# Patient Record
Sex: Male | Born: 1950 | Race: White | Hispanic: No | Smoking: Never smoker
Health system: Southern US, Community
[De-identification: ages and names within clinical notes are randomized; demographics above are authoritative.]

## PROBLEM LIST (undated history)

## (undated) DIAGNOSIS — T8859XA Other complications of anesthesia, initial encounter: Secondary | ICD-10-CM

## (undated) DIAGNOSIS — R42 Dizziness and giddiness: Secondary | ICD-10-CM

## (undated) DIAGNOSIS — M1990 Unspecified osteoarthritis, unspecified site: Secondary | ICD-10-CM

## (undated) DIAGNOSIS — C44310 Basal cell carcinoma of skin of unspecified parts of face: Secondary | ICD-10-CM

## (undated) DIAGNOSIS — K635 Polyp of colon: Secondary | ICD-10-CM

## (undated) DIAGNOSIS — T4145XA Adverse effect of unspecified anesthetic, initial encounter: Secondary | ICD-10-CM

## (undated) DIAGNOSIS — E039 Hypothyroidism, unspecified: Secondary | ICD-10-CM

## (undated) DIAGNOSIS — D649 Anemia, unspecified: Secondary | ICD-10-CM

## (undated) HISTORY — DX: Polyp of colon: K63.5

## (undated) HISTORY — PX: DENTAL SURGERY: SHX609

## (undated) HISTORY — DX: Basal cell carcinoma of skin of unspecified parts of face: C44.310

## (undated) HISTORY — PX: HERNIA REPAIR: SHX51

---
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
## 1991-12-23 HISTORY — PX: THYROID LOBECTOMY: SHX420

## 1991-12-23 HISTORY — PX: BIOPSY THYROID: PRO38

## 1998-12-22 DIAGNOSIS — C44310 Basal cell carcinoma of skin of unspecified parts of face: Secondary | ICD-10-CM

## 1998-12-22 HISTORY — DX: Basal cell carcinoma of skin of unspecified parts of face: C44.310

## 1998-12-22 HISTORY — PX: BASAL CELL CARCINOMA EXCISION: SHX1214

## 2007-12-23 HISTORY — PX: CATARACT EXTRACTION: SUR2

## 2009-12-22 HISTORY — PX: COLONOSCOPY: SHX174

## 2015-07-25 ENCOUNTER — Encounter: Payer: Self-pay | Admitting: General Surgery

## 2015-07-31 ENCOUNTER — Encounter: Payer: Self-pay | Admitting: General Surgery

## 2015-07-31 ENCOUNTER — Ambulatory Visit (INDEPENDENT_AMBULATORY_CARE_PROVIDER_SITE_OTHER): Payer: BLUE CROSS/BLUE SHIELD | Admitting: General Surgery

## 2015-07-31 DIAGNOSIS — K4090 Unilateral inguinal hernia, without obstruction or gangrene, not specified as recurrent: Secondary | ICD-10-CM | POA: Diagnosis not present

## 2015-07-31 NOTE — Progress Notes (Signed)
Patient ID: Samuel [name], male   DOB: 12-19-1951, 64 y.o.   MRN: [id number]  Chief Complaint  Patient presents with  . Hernia    HPI Samuel [name] is a 64 y.o. male here today for a evaluation of a possible hernia. He has noticed a bulge in the right groin. Patient states he noticed this area July 14 2015. Denies any strenuous activity prior to seeing a bulge, sneezing and coughing makes it worse. States the area is a little tender in the morning hours. Bowels are regular and daily.  HPI    Past Medical History  Diagnosis Date  . Basal cell carcinoma of face 2000    Past Surgical History  Procedure Laterality Date  . Tonsillectomy  1956  . Cataract extraction Right 2009  . Biopsy thyroid Left 1993  . Basal cell carcinoma excision  2000    face  . Dental surgery    . Thyroid lobectomy Left 1993    benign    Family History  Problem Relation Age of Onset  . Cancer Mother     uterine    Social History Social History  Substance Use Topics  . Smoking status: Never Smoker   . Smokeless tobacco: Never Used  . Alcohol Use: No    No Known Allergies  Current Outpatient Prescriptions  Medication Sig Dispense Refill  . SYNTHROID 112 MCG tablet Take 1 tablet by mouth daily.  0   No current facility-administered medications for this visit.    Review of Systems Review of Systems  Constitutional: Negative.   Respiratory: Negative.   Cardiovascular: Negative.   Gastrointestinal: Negative for diarrhea and constipation.    Blood pressure 158/80, pulse 80, resp. rate 12, height 6\' 1"  (1.854 m), weight 182 lb (82.555 kg).  Physical Exam Physical Exam  Constitutional: He is oriented to person, place, and time. He appears well-developed and well-nourished.  HENT:  Mouth/Throat: Oropharynx is clear and moist.  Eyes: Conjunctivae are normal. No scleral icterus.  Neck: Neck supple.    Cardiovascular: Normal rate, regular rhythm and normal heart sounds.    Pulmonary/Chest: Effort normal and breath sounds normal.  Abdominal: Soft. Normal appearance. A hernia is present. Hernia confirmed positive in the right inguinal area. Hernia confirmed negative in the left inguinal area.  Genitourinary: Testes normal.  Lymphadenopathy:    He has no cervical adenopathy.  Neurological: He is alert and oriented to person, place, and time.  Skin: Skin is warm and dry.  Psychiatric: His behavior is normal.    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 No evidence of capsular invasion.  Assessment    Symptomatic right inguinal hernia.    Plan    The patient is very active, and elective repair is warranted.     Hernia precautions and incarceration were discussed with the patient. If they develop symptoms of an incarcerated hernia, they were encouraged to seek prompt medical attention.  I have recommended repair of the hernia using mesh on an outpatient basis in the near future. The risk of infection was reviewed. The role of prosthetic mesh to minimize the risk of recurrence was reviewed.  Patient is scheduled for surgery at [facility] on 08/08/15. He will pre admit by phone. Patient is aware of date and instructions.   PCP:  Virgie Dad 08/02/2015, 9:26 AM

## 2015-07-31 NOTE — Patient Instructions (Addendum)

## 2015-08-01 ENCOUNTER — Encounter: Payer: Self-pay | Admitting: General Surgery

## 2015-08-02 DIAGNOSIS — K4090 Unilateral inguinal hernia, without obstruction or gangrene, not specified as recurrent: Secondary | ICD-10-CM | POA: Insufficient documentation

## 2015-08-02 NOTE — H&P (Signed)
Patient ID: Samuel [name], male   DOB: Feb 18, 1951, 64 y.o.   MRN: [id number]  Chief Complaint  Patient presents with  . Hernia    HPI RESHAWN [name] is a 64 y.o. male here today for a evaluation of a possible hernia. He has noticed a bulge in the right groin. Patient states he noticed this area July 14 2015. Denies any strenuous activity prior to seeing a bulge, sneezing and coughing makes it worse. States the area is a little tender in the morning hours. Bowels are regular and daily.  HPI    Past Medical History  Diagnosis Date  . Basal cell carcinoma of face 2000    Past Surgical History  Procedure Laterality Date  . Tonsillectomy  1956  . Cataract extraction Right 2009  . Biopsy thyroid Left 1993  . Basal cell carcinoma excision  2000    face  . Dental surgery    . Thyroid lobectomy Left 1993    benign    Family History  Problem Relation Age of Onset  . Cancer Mother     uterine    Social History Social History  Substance Use Topics  . Smoking status: Never Smoker   . Smokeless tobacco: Never Used  . Alcohol Use: No    No Known Allergies  Current Outpatient Prescriptions  Medication Sig Dispense Refill  . SYNTHROID 112 MCG tablet Take 1 tablet by mouth daily.  0   No current facility-administered medications for this visit.    Review of Systems Review of Systems  Constitutional: Negative.   Respiratory: Negative.   Cardiovascular: Negative.   Gastrointestinal: Negative for diarrhea and constipation.    Blood pressure 158/80, pulse 80, resp. rate 12, height 6\' 1"  (1.854 m), weight 182 lb (82.555 kg).  Physical Exam Physical Exam  Constitutional: He is oriented to person, place, and time. He appears well-developed and well-nourished.  HENT:  Mouth/Throat: Oropharynx is clear and moist.  Eyes: Conjunctivae are normal. No scleral icterus.  Neck: Neck supple.    Cardiovascular: Normal rate, regular rhythm and normal heart sounds.     Pulmonary/Chest: Effort normal and breath sounds normal.  Abdominal: Soft. Normal appearance. A hernia is present. Hernia confirmed positive in the right inguinal area. Hernia confirmed negative in the left inguinal area.  Genitourinary: Testes normal.  Lymphadenopathy:    He has no cervical adenopathy.  Neurological: He is alert and oriented to person, place, and time.  Skin: Skin is warm and dry.  Psychiatric: His behavior is normal.    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 No evidence of capsular invasion.  Assessment    Symptomatic right inguinal hernia.    Plan    The patient is very active, and elective repair is warranted.     Hernia precautions and incarceration were discussed with the patient. If they develop symptoms of an incarcerated hernia, they were encouraged to seek prompt medical attention.  I have recommended repair of the hernia using mesh on an outpatient basis in the near future. The risk of infection was reviewed. The role of prosthetic mesh to minimize the risk of recurrence was reviewed.  Patient is scheduled for surgery at [facility] on 08/08/15. He will pre admit by phone. Patient is aware of date and instructions.   PCP:  Virgie Dad 08/02/2015, 9:26 AM

## 2015-08-03 ENCOUNTER — Encounter: Payer: Self-pay | Admitting: *Deleted

## 2015-08-03 ENCOUNTER — Other Ambulatory Visit: Payer: Self-pay

## 2015-08-03 HISTORY — PX: OTHER SURGICAL HISTORY: SHX169

## 2015-08-03 NOTE — Patient Instructions (Signed)
  Your procedure is scheduled on: 08-08-15 Report to [facility] To find out your arrival time please call [phone] between 1PM - 3PM on 08-07-15  Remember: Instructions that are not followed completely may result in serious medical risk, up to and including death, or upon the discretion of your surgeon and anesthesiologist your surgery may need to be rescheduled.    _X___ 1. Do not eat food or drink liquids after midnight. No gum chewing or hard candies.     _X___ 2. No Alcohol for 24 hours before or after surgery.   ____ 3. Bring all medications with you on the day of surgery if instructed.    ____ 4. Notify your doctor if there is any change in your medical condition     (cold, fever, infections).     Do not wear jewelry, make-up, hairpins, clips or nail polish.  Do not wear lotions, powders, or perfumes. You may wear deodorant.  Do not shave 48 hours prior to surgery. Men may shave face and neck.  Do not bring valuables to the hospital.    [facility] is not responsible for any belongings or valuables.               Contacts, dentures or bridgework may not be worn into surgery.  Leave your suitcase in the car. After surgery it may be brought to your room.  For patients admitted to the hospital, discharge time is determined by your treatment team.   Patients discharged the day of surgery will not be allowed to drive home.   Please read over the following fact sheets that you were given:      __X__ Take these medicines the morning of surgery with A SIP OF WATER:    1. SYNTHROID  2.   3.   4.  5.  6.  ____ Fleet Enema (as directed)   ____ Use CHG Soap as directed  ____ Use inhalers on the day of surgery  ____ Stop metformin 2 days prior to surgery    ____ Take 1/2 of usual insulin dose the night before surgery and none on the morning of surgery.   ____ Stop Coumadin/Plavix/aspirin-N/A  ____ Stop Anti-inflammatories-NO NSAIDS OR ASA  PRODUCTS-TYLENOL OK   ____ Stop supplements until after surgery.    ____ Bring C-Pap to the hospital.

## 2015-08-06 NOTE — Pre-Procedure Instructions (Signed)
Notified Dr Bary Castilla concening patients recent dental implant on 08/03/15.

## 2015-08-08 ENCOUNTER — Ambulatory Visit: Payer: BLUE CROSS/BLUE SHIELD | Admitting: Anesthesiology

## 2015-08-08 ENCOUNTER — Encounter: Payer: Self-pay | Admitting: *Deleted

## 2015-08-08 ENCOUNTER — Encounter
Admission: RE | Disposition: A | Discharge status: Home / Self Care | Payer: Self-pay | Source: Ambulatory Visit | Attending: General Surgery

## 2015-08-08 ENCOUNTER — Ambulatory Visit
Admission: RE | Admit: 2015-08-08 | Discharge: 2015-08-08 | Disposition: A | Discharge status: Home / Self Care | Payer: BLUE CROSS/BLUE SHIELD | Source: Ambulatory Visit | Attending: General Surgery | Admitting: General Surgery

## 2015-08-08 DIAGNOSIS — K4090 Unilateral inguinal hernia, without obstruction or gangrene, not specified as recurrent: Secondary | ICD-10-CM | POA: Insufficient documentation

## 2015-08-08 DIAGNOSIS — Z9849 Cataract extraction status, unspecified eye: Secondary | ICD-10-CM | POA: Insufficient documentation

## 2015-08-08 DIAGNOSIS — Z8049 Family history of malignant neoplasm of other genital organs: Secondary | ICD-10-CM | POA: Insufficient documentation

## 2015-08-08 DIAGNOSIS — Z79899 Other long term (current) drug therapy: Secondary | ICD-10-CM | POA: Diagnosis not present

## 2015-08-08 DIAGNOSIS — Z85828 Personal history of other malignant neoplasm of skin: Secondary | ICD-10-CM | POA: Insufficient documentation

## 2015-08-08 HISTORY — DX: Anemia, unspecified: D64.9

## 2015-08-08 HISTORY — DX: Hypothyroidism, unspecified: E03.9

## 2015-08-08 HISTORY — PX: INGUINAL HERNIA REPAIR: SHX194

## 2015-08-08 SURGERY — REPAIR, HERNIA, INGUINAL, ADULT
Anesthesia: General | Site: Groin | Laterality: Right | Wound class: Clean

## 2015-08-08 MED ORDER — CEFAZOLIN SODIUM-DEXTROSE 2-3 GM-% IV SOLR
INTRAVENOUS | Status: AC
  Administered 2015-08-08: 2 g via INTRAVENOUS
  Filled 2015-08-08: qty 50

## 2015-08-08 MED ORDER — BUPIVACAINE-EPINEPHRINE (PF) 0.5% -1:200000 IJ SOLN
INTRAMUSCULAR | Status: DC | PRN
Start: 2015-08-08 — End: 2015-08-08
  Administered 2015-08-08: 30 mL

## 2015-08-08 MED ORDER — FENTANYL CITRATE (PF) 100 MCG/2ML IJ SOLN: 25.0000 ug | INTRAMUSCULAR | Status: DC | PRN

## 2015-08-08 MED ORDER — LACTATED RINGERS IV SOLN
INTRAVENOUS | Status: DC
  Administered 2015-08-08 (×2): via INTRAVENOUS

## 2015-08-08 MED ORDER — ONDANSETRON HCL 4 MG/2ML IJ SOLN
INTRAMUSCULAR | Status: DC | PRN
  Administered 2015-08-08: 4 mg via INTRAVENOUS

## 2015-08-08 MED ORDER — OXYCODONE HCL 5 MG PO TABS: 5.0000 mg | ORAL_TABLET | Freq: Once | ORAL | Status: DC | PRN

## 2015-08-08 MED ORDER — KETOROLAC TROMETHAMINE 30 MG/ML IJ SOLN
INTRAMUSCULAR | Status: DC | PRN
  Administered 2015-08-08: 30 mg via INTRAVENOUS

## 2015-08-08 MED ORDER — MIDAZOLAM HCL 2 MG/2ML IJ SOLN
INTRAMUSCULAR | Status: DC | PRN
  Administered 2015-08-08: 2 mg via INTRAVENOUS

## 2015-08-08 MED ORDER — FENTANYL CITRATE (PF) 100 MCG/2ML IJ SOLN
INTRAMUSCULAR | Status: DC | PRN
Start: 2015-08-08 — End: 2015-08-08
  Administered 2015-08-08 (×2): 100 ug via INTRAVENOUS

## 2015-08-08 MED ORDER — CEFAZOLIN SODIUM-DEXTROSE 2-3 GM-% IV SOLR
2.0000 g | INTRAVENOUS | Status: AC
  Administered 2015-08-08: 2 g via INTRAVENOUS

## 2015-08-08 MED ORDER — FAMOTIDINE 20 MG PO TABS
ORAL_TABLET | ORAL | Status: AC
  Administered 2015-08-08: 20 mg via ORAL
  Filled 2015-08-08: qty 1

## 2015-08-08 MED ORDER — OXYCODONE HCL 5 MG/5ML PO SOLN: 5.0000 mg | Freq: Once | ORAL | Status: DC | PRN

## 2015-08-08 MED ORDER — PROPOFOL 10 MG/ML IV BOLUS
INTRAVENOUS | Status: DC | PRN
  Administered 2015-08-08: 200 mg via INTRAVENOUS

## 2015-08-08 MED ORDER — BUPIVACAINE-EPINEPHRINE (PF) 0.5% -1:200000 IJ SOLN
INTRAMUSCULAR | Status: AC
  Filled 2015-08-08: qty 30

## 2015-08-08 MED ORDER — FAMOTIDINE 20 MG PO TABS
20.0000 mg | ORAL_TABLET | Freq: Once | ORAL | Status: AC
  Administered 2015-08-08: 20 mg via ORAL

## 2015-08-08 MED ORDER — HYDROCODONE-ACETAMINOPHEN 5-325 MG PO TABS: 1.0000 | ORAL_TABLET | Freq: Four times a day (QID) | ORAL | Status: DC | PRN

## 2015-08-08 MED ORDER — ACETAMINOPHEN 10 MG/ML IV SOLN
INTRAVENOUS | Status: DC | PRN
  Administered 2015-08-08: 1000 mg via INTRAVENOUS

## 2015-08-08 MED ORDER — DEXAMETHASONE SODIUM PHOSPHATE 4 MG/ML IJ SOLN
INTRAMUSCULAR | Status: DC | PRN
  Administered 2015-08-08: 10 mg via INTRAVENOUS

## 2015-08-08 MED ORDER — ACETAMINOPHEN 10 MG/ML IV SOLN
INTRAVENOUS | Status: AC
  Filled 2015-08-08: qty 100

## 2015-08-08 SURGICAL SUPPLY — 33 items
BLADE SURG 15 STRL SS SAFETY (BLADE) ×6 IMPLANT
CANISTER SUCT 1200ML W/VALVE (MISCELLANEOUS) ×3 IMPLANT
CHLORAPREP W/TINT 26ML (MISCELLANEOUS) ×3 IMPLANT
CLOSURE WOUND 1/2 X4 (GAUZE/BANDAGES/DRESSINGS) ×1
DECANTER SPIKE VIAL GLASS SM (MISCELLANEOUS) ×3 IMPLANT
DRAIN PENROSE 1/4X12 LTX (DRAIN) ×3 IMPLANT
DRAPE LAPAROTOMY 100X77 ABD (DRAPES) ×3 IMPLANT
DRESSING TELFA 4X3 1S ST N-ADH (GAUZE/BANDAGES/DRESSINGS) ×3 IMPLANT
DRSG TEGADERM 4X4.75 (GAUZE/BANDAGES/DRESSINGS) ×3 IMPLANT
GLOVE BIO SURGEON STRL SZ7.5 (GLOVE) ×3 IMPLANT
GLOVE INDICATOR 8.0 STRL GRN (GLOVE) ×3 IMPLANT
GOWN STRL REUS W/ TWL LRG LVL3 (GOWN DISPOSABLE) ×2 IMPLANT
GOWN STRL REUS W/TWL LRG LVL3 (GOWN DISPOSABLE) ×4
KIT RM TURNOVER STRD PROC AR (KITS) ×3 IMPLANT
LABEL OR SOLS (LABEL) ×3 IMPLANT
MESH HERNIA 6X12 ULTRAPRO MED (Mesh General) ×1 IMPLANT
MESH HERNIA ULTRAPRO MED (Mesh General) ×2 IMPLANT
NDL SAFETY 22GX1.5 (NEEDLE) ×6 IMPLANT
NDL SAFETY 25GX1.5 (NEEDLE) ×3 IMPLANT
PACK BASIN MINOR ARMC (MISCELLANEOUS) ×3 IMPLANT
PAD GROUND ADULT SPLIT (MISCELLANEOUS) ×3 IMPLANT
STRIP CLOSURE SKIN 1/2X4 (GAUZE/BANDAGES/DRESSINGS) ×2 IMPLANT
SUT SURGILON 0 BLK (SUTURE) ×6 IMPLANT
SUT VIC AB 2-0 SH 27 (SUTURE) ×2
SUT VIC AB 2-0 SH 27XBRD (SUTURE) ×1 IMPLANT
SUT VIC AB 3-0 54X BRD REEL (SUTURE) ×1 IMPLANT
SUT VIC AB 3-0 BRD 54 (SUTURE) ×2
SUT VIC AB 3-0 SH 27 (SUTURE) ×2
SUT VIC AB 3-0 SH 27X BRD (SUTURE) ×1 IMPLANT
SUT VIC AB 4-0 FS2 27 (SUTURE) ×3 IMPLANT
SWABSTK COMLB BENZOIN TINCTURE (MISCELLANEOUS) ×3 IMPLANT
SYR 3ML LL SCALE MARK (SYRINGE) ×3 IMPLANT
SYR CONTROL 10ML (SYRINGE) ×6 IMPLANT

## 2015-08-08 NOTE — Discharge Instructions (Signed)
AMBULATORY SURGERY  DISCHARGE INSTRUCTIONS   1) The drugs that you were given will stay in your system until tomorrow so for the next 24 hours you should not:  A) Drive an automobile B) Make any legal decisions C) Drink any alcoholic beverage   2) You may resume regular meals tomorrow.  Today it is better to start with liquids and gradually work up to solid foods.  You may eat anything you prefer, but it is better to start with liquids, then soup and crackers, and gradually work up to solid foods.   3) Please notify your doctor immediately if you have any unusual bleeding, trouble breathing, redness and pain at the surgery site, drainage, fever, or pain not relieved by medication.    4) Additional Instructions: Ice pack to incisional area as needed.  Splint incisional area when you cough, sneeze or move.        Please contact your physician with any problems or Same Day Surgery at [phone], Monday through Friday 6 am to 4 pm, or [place] at [facility] number at [phone].

## 2015-08-08 NOTE — Transfer of Care (Signed)
Immediate Anesthesia Transfer of Care Note  Patient: Samuel [name]  Procedure(s) Performed: Procedure(s): HERNIA REPAIR INGUINAL ADULT (Right)  Patient Location: PACU  Anesthesia Type:General  Level of Consciousness: sedated  Airway & Oxygen Therapy: Patient Spontanous Breathing and Patient connected to face mask oxygen  Post-op Assessment: Report given to RN  Post vital signs: Reviewed and stable  Last Vitals:  Filed Vitals:   08/08/15 1139  BP: 176/93  Pulse: 81  Temp: 36.9 C  Resp: 16    Complications: No apparent anesthesia complications

## 2015-08-08 NOTE — H&P (Signed)
No change in clinical history or exam.  Clear cardiopulmonary exam.  Patient will continue his oxacillin for dental implant infection prevention post surgery.  Surgical site initialed.

## 2015-08-08 NOTE — Anesthesia Preprocedure Evaluation (Addendum)
Anesthesia Evaluation  Patient identified by MRN, date of birth, ID band Patient awake    Reviewed: Allergy & Precautions, H&P , NPO status , Patient's Chart, lab work & pertinent test results  History of Anesthesia Complications Negative for: history of anesthetic complications  Airway Mallampati: II  TM Distance: >3 FB Neck ROM: full    Dental no notable dental hx. (+) Teeth Intact   Pulmonary neg pulmonary ROS,  breath sounds clear to auscultation  Pulmonary exam normal       Cardiovascular negative cardio ROS Normal cardiovascular examRhythm:regular Rate:Normal     Neuro/Psych negative neurological ROS  negative psych ROS   GI/Hepatic negative GI ROS, Neg liver ROS,   Endo/Other  negative endocrine ROSHypothyroidism   Renal/GU negative Renal ROS  negative genitourinary   Musculoskeletal   Abdominal   Peds  Hematology negative hematology ROS (+)   Anesthesia Other Findings Past Medical History:   Basal cell carcinoma of face                    2000         Hypothyroidism                                               Anemia                                                         Comment:h/o   Reproductive/Obstetrics negative OB ROS                            Anesthesia Physical Anesthesia Plan  ASA: III  Anesthesia Plan: General ETT   Post-op Pain Management:    Induction:   Airway Management Planned:   Additional Equipment:   Intra-op Plan:   Post-operative Plan:   Informed Consent: I have reviewed the patients History and Physical, chart, labs and discussed the procedure including the risks, benefits and alternatives for the proposed anesthesia with the patient or authorized representative who has indicated his/her understanding and acceptance.   Dental Advisory Given  Plan Discussed with: Anesthesiologist, CRNA and Surgeon  Anesthesia Plan Comments:          Anesthesia Quick Evaluation

## 2015-08-08 NOTE — Anesthesia Postprocedure Evaluation (Signed)
  Anesthesia Post-op Note  Patient: Samuel [name]  Procedure(s) Performed: Procedure(s): HERNIA REPAIR INGUINAL ADULT (Right)  Anesthesia type:General ETT  Patient location: PACU  Post pain: Pain level controlled  Post assessment: Post-op Vital signs reviewed, Patient's Cardiovascular Status Stable, Respiratory Function Stable, Patent Airway and No signs of Nausea or vomiting  Post vital signs: Reviewed and stable  Last Vitals:  Filed Vitals:   08/08/15 1458  BP: 140/83  Pulse: 70  Temp:   Resp: 18    Level of consciousness: awake, alert  and patient cooperative  Complications: No apparent anesthesia complications

## 2015-08-08 NOTE — Op Note (Signed)
Preoperative diagnosis: Right inguinal hernia.  Postoperative diagnosis: Same.  Operative procedure: Right inguinal hernia repair with medium Ultra Pro mesh.  Operative surgeon: Ollen Bowl, M.D.  Anesthesia: Gen. by LMA, Marcaine 0.5% with 1-200,000 units of epinephrine, 30 mL; Toradol 30 mg.  Estimated blood loss: Less than 5 mL.  Clinical note this 64 year old male who is an ultrasound medical right inguinal hernia was amenable for elective repair. He received Kefzol prior to procedure.  Operative note: With the patient had general anesthesia the right groin was prepped with chlor prep and drape. A 5 cm skin line incision was made after field block anesthesia was established with Marcaine with epinephrine. The skin was incised sharply and the remaining dissection completed with electrocautery. The external oblique was opened in the direction of its fibers. The cord was mobilized and a generous sized indirect sac was noted. Just general laxity in the posterior floor was noted. The hernia sac was dissected free of the cord structured and returned to the preperitoneal space. The preperitoneal space was cleared medium ultra Pro mesh space placed. The external component was laid along the floor the inguinal canal and anchored to the pubic tubercle with 0 Surgilon. It was then tacked to the inguinal ligament with interrupted 0 Surgilon sutures. A lateral slit was made for cord passage and closed with similar sutures. The medial and superior borders of the mesh were anchored to the transverse abdominis aponeurosis was interrupted 0 Surgilon sutures. The ilioinguinal nerve was identified. The iliohypogastric nerve was not seen. Care was taken to avoid nerve injury. The cord was returned to its bed. Toradol was placed in the wound. The external oblique was closed with a running 2-0 Vicryls suture. Plantar Scarpa's fascia was closed with a running 3-0 Vicryls suture. The skin was closed with a running 4-0  Vicryls suture. Benzoin, Steri-Strips, Telfa and Tegaderm dressings were applied.  The patient tolerated the procedure well and was taken to recovery in stable condition.

## 2015-08-08 NOTE — Anesthesia Procedure Notes (Signed)
Procedure Name: Awake intubation Date/Time: 08/08/2015 11:40 AM Performed by: Nelda Marseille Pre-anesthesia Checklist: Patient identified, Patient being monitored, Timeout performed, Emergency Drugs available and Suction available Patient Re-evaluated:Patient Re-evaluated prior to inductionOxygen Delivery Method: Circle system utilized Preoxygenation: Pre-oxygenation with 100% oxygen Intubation Type: IV induction Ventilation: Mask ventilation without difficulty LMA: LMA inserted LMA Size: 4.5 and 4.0 Laryngoscope Size: Mac and 3 Grade View: Grade I Tube type: Oral Tube size: 7.0 mm Number of attempts: 1 Airway Equipment and Method: Stylet Placement Confirmation: ETT inserted through vocal cords under direct vision,  positive ETCO2 and breath sounds checked- equal and bilateral Secured at: 21 cm Tube secured with: Tape Dental Injury: Teeth and Oropharynx as per pre-operative assessment  Comments: 4.5 LMA inserted.  Leak detected.  4.5 taken out and size 4 placed.  No leaks good ET CO2 and exp TV.  No complications.

## 2015-08-08 NOTE — OR Nursing (Signed)
5 Dr. Bary Castilla into see pt and daughter.

## 2015-08-09 ENCOUNTER — Encounter: Payer: Self-pay | Admitting: General Surgery

## 2015-08-15 ENCOUNTER — Ambulatory Visit (INDEPENDENT_AMBULATORY_CARE_PROVIDER_SITE_OTHER): Payer: BLUE CROSS/BLUE SHIELD | Admitting: General Surgery

## 2015-08-15 ENCOUNTER — Encounter: Payer: Self-pay | Admitting: General Surgery

## 2015-08-15 VITALS — BP 122/62 | HR 68 | Resp 12 | Ht 73.0 in | Wt 182.0 lb

## 2015-08-15 DIAGNOSIS — K4090 Unilateral inguinal hernia, without obstruction or gangrene, not specified as recurrent: Secondary | ICD-10-CM

## 2015-08-15 DIAGNOSIS — R58 Hemorrhage, not elsewhere classified: Secondary | ICD-10-CM | POA: Insufficient documentation

## 2015-08-15 NOTE — Progress Notes (Signed)
Patient ID: Samuel [name], male   DOB: Aug 29, 1951, 64 y.o.   MRN: [id number]  Chief Complaint  Patient presents with  . Routine Post Op    HPI Samuel [name] is a 64 y.o. male.  Here today for his postoperative visit, right inguinal hernia on 08-08-15. He states he is doing well. He has went back to work as well.  HPI  Past Medical History  Diagnosis Date  . Basal cell carcinoma of face 2000  . Hypothyroidism   . Anemia     h/o    Past Surgical History  Procedure Laterality Date  . Tonsillectomy  1956  . Cataract extraction Right 2009  . Biopsy thyroid Left 1993  . Basal cell carcinoma excision  2000    face  . Dental surgery    . Thyroid lobectomy Left 1993    benign  . Dental implant  08-03-15  . Inguinal hernia repair Right 08/08/2015    Procedure: HERNIA REPAIR INGUINAL ADULT;  Surgeon: Robert Bellow, MD;  Location: [facility] ORS;  Service: General;  Laterality: Right;    Family History  Problem Relation Age of Onset  . Cancer Mother     uterine    Social History Social History  Substance Use Topics  . Smoking status: Never Smoker   . Smokeless tobacco: Never Used  . Alcohol Use: No    No Known Allergies  Current Outpatient Prescriptions  Medication Sig Dispense Refill  . MULTIPLE VITAMIN PO Take 1 tablet by mouth every morning.    Marland Kitchen SYNTHROID 112 MCG tablet Take 1 tablet by mouth daily.  0   No current facility-administered medications for this visit.    Review of Systems Review of Systems  Constitutional: Negative.   Respiratory: Negative.   Cardiovascular: Negative.     Blood pressure 122/62, pulse 68, resp. rate 12, height 6\' 1"  (1.854 m), weight 182 lb (82.555 kg).  Physical Exam Physical Exam  Abdominal: Soft. There is no tenderness. No hernia.  Genitourinary:     Testicles are symmetrical. Area of ecchymosis suggested bleeding from a vessel along the pubic tubercle.      Assessment    Doing well status post right angle hernia  repair.  Extensive ecchymosis, resolving.    Plan    The patient will increase his activity as tolerated. Proper lifting technique was reviewed.     follow-up will be in one month for final examination.   PCP:  Ronna Polio, Kelvin Cellar 08/15/2015, 11:36 AM

## 2015-09-11 ENCOUNTER — Ambulatory Visit (INDEPENDENT_AMBULATORY_CARE_PROVIDER_SITE_OTHER): Payer: BLUE CROSS/BLUE SHIELD | Admitting: General Surgery

## 2015-09-11 VITALS — BP 116/64 | HR 70 | Resp 12 | Ht 73.0 in | Wt 181.0 lb

## 2015-09-11 DIAGNOSIS — K4090 Unilateral inguinal hernia, without obstruction or gangrene, not specified as recurrent: Secondary | ICD-10-CM

## 2015-09-11 NOTE — Progress Notes (Signed)
Patient ID: Samuel [name], male   DOB: 19-Oct-1951, 64 y.o.   MRN: [id number]  Chief Complaint  Patient presents with  . Follow-up    right inguinal hernia    HPI Samuel [name] is a 64 y.o. male.Here today for his postoperative visit, right inguinal hernia on 08-08-15. He states he is doing well.   HPI  Past Medical History  Diagnosis Date  . Basal cell carcinoma of face 2000  . Hypothyroidism   . Anemia     h/o    Past Surgical History  Procedure Laterality Date  . Tonsillectomy  1956  . Cataract extraction Right 2009  . Biopsy thyroid Left 1993  . Basal cell carcinoma excision  2000    face  . Dental surgery    . Thyroid lobectomy Left 1993    benign  . Dental implant  08-03-15  . Inguinal hernia repair Right 08/08/2015    Procedure: HERNIA REPAIR INGUINAL ADULT;  Surgeon: Robert Bellow, MD;  Location: [facility] ORS;  Service: General;  Laterality: Right;    Family History  Problem Relation Age of Onset  . Cancer Mother     uterine    Social History Social History  Substance Use Topics  . Smoking status: Never Smoker   . Smokeless tobacco: Never Used  . Alcohol Use: No    No Known Allergies  Current Outpatient Prescriptions  Medication Sig Dispense Refill  . MULTIPLE VITAMIN PO Take 1 tablet by mouth every morning.    Marland Kitchen SYNTHROID 112 MCG tablet Take 1 tablet by mouth daily.  0   No current facility-administered medications for this visit.    Review of Systems Review of Systems  Constitutional: Negative.   Respiratory: Negative.   Cardiovascular: Negative.     Blood pressure 116/64, pulse 70, resp. rate 12, height 6\' 1"  (1.854 m), weight 181 lb (82.101 kg).  Physical Exam Physical Exam  Constitutional: He is oriented to person, place, and time. He appears well-developed and well-nourished.  Abdominal:    Right inguinal hernia repair is intact and healing well.   Neurological: He is alert and oriented to person, place, and time.  Skin: Skin is  warm and dry.      Assessment    Doing well status post right inguinal hernia repair.  Resolution of extensive ecchymosis noted on postoperative visit #1.    Plan    The patient will increase his activity as tolerated. Proper lifting technique was reviewed.    Patient to return as needed. PCP:  Mack Hook 09/11/2015, 9:36 PM

## 2015-09-11 NOTE — Patient Instructions (Addendum)
Patient to return as needed. 

## 2015-12-26 ENCOUNTER — Other Ambulatory Visit: Payer: Self-pay | Admitting: Family Medicine

## 2016-01-11 LAB — HEMOGLOBIN A1C: Hemoglobin A1C: 5.8

## 2016-01-11 LAB — LIPID PANEL
CHOLESTEROL: 191 mg/dL (ref 0–200)
HDL: 73 mg/dL — AB (ref 35–70)
LDL CALC: 104 mg/dL
TRIGLYCERIDES: 70 mg/dL (ref 40–160)

## 2016-01-11 LAB — PSA: PSA: 1

## 2016-01-11 LAB — TSH: TSH: 1.3 u[IU]/mL (ref <=5.90)

## 2016-01-11 LAB — BASIC METABOLIC PANEL
CREATININE: 0.9 mg/dL (ref <=1.3)
GLUCOSE: 102 mg/dL
POTASSIUM: 4.3 mmol/L (ref 3.4–5.3)

## 2016-01-11 LAB — HEPATIC FUNCTION PANEL
ALT: 32 U/L (ref 10–40)
AST: 28 U/L (ref 14–40)

## 2016-02-06 ENCOUNTER — Ambulatory Visit: Payer: BLUE CROSS/BLUE SHIELD | Admitting: General Surgery

## 2016-02-07 ENCOUNTER — Encounter: Payer: Self-pay | Admitting: General Surgery

## 2016-02-07 ENCOUNTER — Ambulatory Visit (INDEPENDENT_AMBULATORY_CARE_PROVIDER_SITE_OTHER): Payer: Medicare Other | Admitting: General Surgery

## 2016-02-07 VITALS — BP 156/86 | HR 80 | Resp 14 | Ht 73.0 in | Wt 184.0 lb

## 2016-02-07 DIAGNOSIS — Z86010 Personal history of colonic polyps: Secondary | ICD-10-CM

## 2016-02-07 DIAGNOSIS — K4090 Unilateral inguinal hernia, without obstruction or gangrene, not specified as recurrent: Secondary | ICD-10-CM

## 2016-02-07 NOTE — Patient Instructions (Addendum)
The patient is aware to call back for any questions or concerns. Inguinal Hernia, Adult Muscles help keep everything in the body in its proper place. But if a weak spot in the muscles develops, something can poke through. That is called a hernia. When this happens in the lower part of the belly (abdomen), it is called an inguinal hernia. (It takes its name from a part of the body in this region called the inguinal canal.) A weak spot in the wall of muscles lets some fat or part of the small intestine bulge through. An inguinal hernia can develop at any age. Men get them more often than women. CAUSES  In adults, an inguinal hernia develops over time.  It can be triggered by:  Suddenly straining the muscles of the lower abdomen.  Lifting heavy objects.  Straining to have a bowel movement. Difficult bowel movements (constipation) can lead to this.  Constant coughing. This may be caused by smoking or lung disease.  Being overweight.  Being pregnant.  Working at a job that requires long periods of standing or heavy lifting.  Having had an inguinal hernia before. One type can be an emergency situation. It is called a strangulated inguinal hernia. It develops if part of the small intestine slips through the weak spot and cannot get back into the abdomen. The blood supply can be cut off. If that happens, part of the intestine may die. This situation requires emergency surgery. SYMPTOMS  Often, a small inguinal hernia has no symptoms. It is found when a healthcare provider does a physical exam. Larger hernias usually have symptoms.   In adults, symptoms may include:  A lump in the groin. This is easier to see when the person is standing. It might disappear when lying down.  In men, a lump in the scrotum.  Pain or burning in the groin. This occurs especially when lifting, straining or coughing.  A dull ache or feeling of pressure in the groin.  Signs of a strangulated hernia can  include:  A bulge in the groin that becomes very painful and tender to the touch.  A bulge that turns red or purple.  Fever, nausea and vomiting.  Inability to have a bowel movement or to pass gas. DIAGNOSIS  To decide if you have an inguinal hernia, a healthcare provider will probably do a physical examination.  This will include asking questions about any symptoms you have noticed.  The healthcare provider might feel the groin area and ask you to cough. If an inguinal hernia is felt, the healthcare provider may try to slide it back into the abdomen.  Usually no other tests are needed. TREATMENT  Treatments can vary. The size of the hernia makes a difference. Options include:  Watchful waiting. This is often suggested if the hernia is small and you have had no symptoms.  No medical procedure will be done unless symptoms develop.  You will need to watch closely for symptoms. If any occur, contact your healthcare provider right away.  Surgery. This is used if the hernia is larger or you have symptoms.  Open surgery. This is usually an outpatient procedure (you will not stay overnight in a hospital). An cut (incision) is made through the skin in the groin. The hernia is put back inside the abdomen. The weak area in the muscles is then repaired by herniorrhaphy or hernioplasty. Herniorrhaphy: in this type of surgery, the weak muscles are sewn back together. Hernioplasty: a patch or mesh is   used to close the weak area in the abdominal wall.  Laparoscopy. In this procedure, a surgeon makes small incisions. A thin tube with a tiny video camera (called a laparoscope) is put into the abdomen. The surgeon repairs the hernia with mesh by looking with the video camera and using two long instruments. HOME CARE INSTRUCTIONS   After surgery to repair an inguinal hernia:  You will need to take pain medicine prescribed by your healthcare provider. Follow all directions carefully.  You will need  to take care of the wound from the incision.  Your activity will be restricted for awhile. This will probably include no heavy lifting for several weeks. You also should not do anything too active for a few weeks. When you can return to work will depend on the type of job that you have.  During "watchful waiting" periods, you should:  Maintain a healthy weight.  Eat a diet high in fiber (fruits, vegetables and whole grains).  Drink plenty of fluids to avoid constipation. This means drinking enough water and other liquids to keep your urine clear or pale yellow.  Do not lift heavy objects.  Do not stand for long periods of time.  Quit smoking. This should keep you from developing a frequent cough. SEEK MEDICAL CARE IF:   A bulge develops in your groin area.  You feel pain, a burning sensation or pressure in the groin. This might be worse if you are lifting or straining.  You develop a fever of more than 100.5 F (38.1 C). SEEK IMMEDIATE MEDICAL CARE IF:   Pain in the groin increases suddenly.  A bulge in the groin gets bigger suddenly and does not go down.  For men, there is sudden pain in the scrotum. Or, the size of the scrotum increases.  A bulge in the groin area becomes red or purple and is painful to touch.  You have nausea or vomiting that does not go away.  You feel your heart beating much faster than normal.  You cannot have a bowel movement or pass gas.  You develop a fever of more than 102.0 F (38.9 C).   This information is not intended to replace advice given to you by your health care provider. Make sure you discuss any questions you have with your health care provider.   Document Released: 04/26/2009 Document Revised: 03/01/2012 Document Reviewed: 06/11/2015 Elsevier Interactive Patient Education 2016 [employer].   Patient's surgery has been scheduled for 02-28-16 at [facility].

## 2016-02-07 NOTE — Progress Notes (Signed)
Patient ID: Samuel [name], male   DOB: Sep 28, 1951, 65 y.o.   MRN: [id number]  Chief Complaint  Patient presents with  . Hernia    HPI Samuel [name] is a 65 y.o. male.  Here today for evaluation of a left hernia. He does not remember doing and thing that triggered the left groin knot. He states he noticed it at the end of January. He states he did have a sinus infection over Christmas and had a bad cough.  He did start with an upper respiratory cold, cough and congestion on Monday. The patient underwent repair of an indirect inguinal hernia in August 2016 making use of a medium Ultra Pro mesh. I personally reviewed the patient's history.  HPI  Past Medical History  Diagnosis Date  . Basal cell carcinoma of face 2000  . Hypothyroidism   . Anemia     h/o  . Colon polyp     Past Surgical History  Procedure Laterality Date  . Tonsillectomy  1956  . Cataract extraction Right 2009  . Biopsy thyroid Left 1993  . Basal cell carcinoma excision  2000    face  . Dental surgery    . Thyroid lobectomy Left 1993    benign  . Dental implant  08-03-15  . Inguinal hernia repair Right 08/08/2015    Procedure: HERNIA REPAIR INGUINAL ADULT;  Surgeon: Samuel Bellow, MD;  Location: [facility] ORS;  Service: General;  Laterality: Right;  . Colonoscopy  2011    Family History  Problem Relation Age of Onset  . Cancer Mother     uterine    Social History Social History  Substance Use Topics  . Smoking status: Never Smoker   . Smokeless tobacco: Never Used  . Alcohol Use: No    No Known Allergies  Current Outpatient Prescriptions  Medication Sig Dispense Refill  . MULTIPLE VITAMIN PO Take 1 tablet by mouth every morning.    . pseudoephedrine (SUDAFED) 30 MG tablet Take 30 mg by mouth every 4 (four) hours as needed for congestion.    Marland Kitchen SYNTHROID 112 MCG tablet TAKE ONE (1) TABLET EACH DAY 90 tablet 1   No current facility-administered medications for this visit.    Review of  Systems Review of Systems  Constitutional: Negative.   Respiratory: Negative.   Cardiovascular: Negative.     Blood pressure 156/86, pulse 80, resp. rate 14, height 6\' 1"  (1.854 m), weight 184 lb (83.462 kg).  Physical Exam Physical Exam  Constitutional: He is oriented to person, place, and time. He appears well-developed and well-nourished.  HENT:  Mouth/Throat: Oropharynx is clear and moist.  Eyes: Conjunctivae are normal. No scleral icterus.  Neck: Neck supple.  Cardiovascular: Normal rate, regular rhythm and normal heart sounds.   Pulmonary/Chest: Effort normal and breath sounds normal.  Abdominal: Soft. There is no tenderness. A hernia is present. Hernia confirmed positive in the left inguinal area. Hernia confirmed negative in the right inguinal area.  Genitourinary:  Right hernia repair intact.  Lymphadenopathy:    He has no cervical adenopathy.  Neurological: He is alert and oriented to person, place, and time.  Skin: Skin is warm and dry.  Psychiatric: His behavior is normal.    Data Reviewed Colonoscopy report from 12/20/2010 reviewed. 2 mm cecal polyp. Tubular adenoma. No atypia. Follow-up exam in 5 years would be appropriate.  Assessment    Symptomatic left inguinal hernia.  Intact right inguinal hernia repair.  Previous colonic polyp, candidate for follow-up  colonoscopy.    Plan    The patient once to proceed with hernia repair first and then have a colonoscopy to follow. This is reasonable with no GI symptoms and the minimal findings at the time of his December 2011 exam.    Hernia precautions and incarceration were discussed with the patient. If they develop symptoms of an incarcerated hernia, they were encouraged to seek prompt medical attention.  I have recommended repair of the hernia using mesh on an outpatient basis in the near future. The risk of infection was reviewed. The role of prosthetic mesh to minimize the risk of recurrence was  reviewed.  Patient's surgery has been scheduled for [facility]-28-16 at [name]  PCP:  Sam[name] This information has been scribed by [name] [place].    Samuel [name] 02/08/2016, 8:12 AM

## 2016-02-08 DIAGNOSIS — K4090 Unilateral inguinal hernia, without obstruction or gangrene, not specified as recurrent: Secondary | ICD-10-CM | POA: Insufficient documentation

## 2016-02-08 DIAGNOSIS — Z86010 Personal history of colonic polyps: Secondary | ICD-10-CM | POA: Insufficient documentation

## 2016-02-08 NOTE — H&P (Signed)
HPI  Samuel [name] is a 65 y.o. male. Here today for evaluation of a left hernia. He does not remember doing and thing that triggered the left groin knot. He states he noticed it at the end of January. He states he did have a sinus infection over Christmas and had a bad cough.  He did start with an upper respiratory cold, cough and congestion on Monday.  The patient underwent repair of an indirect inguinal hernia in August 2016 making use of a medium Ultra Pro mesh.  I personally reviewed the patient's history.  HPI  Past Medical History   Diagnosis  Date   .  Basal cell carcinoma of face  2000   .  Hypothyroidism    .  Anemia      h/o   .  Colon polyp     Past Surgical History   Procedure  Laterality  Date   .  Tonsillectomy   1956   .  Cataract extraction  Right  2009   .  Biopsy thyroid  Left  1993   .  Basal cell carcinoma excision   2000     face   .  Dental surgery     .  Thyroid lobectomy  Left  1993     benign   .  Dental implant   08-03-15   .  Inguinal hernia repair  Right  08/08/2015     Procedure: HERNIA REPAIR INGUINAL ADULT; Surgeon: Robert Bellow, MD; Location: [facility] ORS; Service: General; Laterality: Right;   .  Colonoscopy   2011    Family History   Problem  Relation  Age of Onset   .  Cancer  Mother      uterine    Social History  Social History   Substance Use Topics   .  Smoking status:  Never Smoker   .  Smokeless tobacco:  Never Used   .  Alcohol Use:  No    No Known Allergies  Current Outpatient Prescriptions   Medication  Sig  Dispense  Refill   .  MULTIPLE VITAMIN PO  Take 1 tablet by mouth every morning.     .  pseudoephedrine (SUDAFED) 30 MG tablet  Take 30 mg by mouth every 4 (four) hours as needed for congestion.     Marland Kitchen  SYNTHROID 112 MCG tablet  TAKE ONE (1) TABLET EACH DAY  90 tablet  1    No current facility-administered medications for this visit.    Review of Systems  Review of Systems  Constitutional: Negative.  Respiratory:  Negative.  Cardiovascular: Negative.   Blood pressure 156/86, pulse 80, resp. rate 14, height 6\' 1"  (1.854 m), weight 184 lb (83.462 kg).  Physical Exam  Physical Exam  Constitutional: He is oriented to person, place, and time. He appears well-developed and well-nourished.  HENT:  Mouth/Throat: Oropharynx is clear and moist.  Eyes: Conjunctivae are normal. No scleral icterus.  Neck: Neck supple.  Cardiovascular: Normal rate, regular rhythm and normal heart sounds.  Pulmonary/Chest: Effort normal and breath sounds normal.  Abdominal: Soft. There is no tenderness. A hernia is present. Hernia confirmed positive in the left inguinal area. Hernia confirmed negative in the right inguinal area.  Genitourinary:  Right hernia repair intact.  Lymphadenopathy:  He has no cervical adenopathy.  Neurological: He is alert and oriented to person, place, and time.  Skin: Skin is warm and dry.  Psychiatric: His behavior is normal.   Data  Reviewed  Colonoscopy report from 12/20/2010 reviewed. 2 mm cecal polyp. Tubular adenoma. No atypia. Follow-up exam in 5 years would be appropriate.  Assessment   Symptomatic left inguinal hernia.  Intact right inguinal hernia repair.  Previous colonic polyp, candidate for follow-up colonoscopy.   Plan   The patient once to proceed with hernia repair first and then have a colonoscopy to follow. This is reasonable with no GI symptoms and the minimal findings at the time of his December 2011 exam.   Hernia precautions and incarceration were discussed with the patient. If they develop symptoms of an incarcerated hernia, they were encouraged to seek prompt medical attention.  I have recommended repair of the hernia using mesh on an outpatient basis in the near future. The risk of infection was reviewed. The role of prosthetic mesh to minimize the risk of recurrence was reviewed.  Patient's surgery has been scheduled for 02-28-16 at [facility].  PCP: Larene Beach  This  information has been scribed by Karie Fetch [place].  Robert Bellow  02/08/2016, 8:12 AM

## 2016-02-20 ENCOUNTER — Encounter: Payer: Self-pay | Admitting: *Deleted

## 2016-02-20 ENCOUNTER — Other Ambulatory Visit: Payer: BLUE CROSS/BLUE SHIELD

## 2016-02-20 NOTE — Patient Instructions (Signed)
  Your procedure is scheduled on: 02-28-16 (THURSDAY) Report to [facility] To find out your arrival time please call [phone] between 1PM - 3PM on 02-27-16 [facility])  Remember: Instructions that are not followed completely may result in serious medical risk, up to and including death, or upon the discretion of your surgeon and anesthesiologist your surgery may need to be rescheduled.    _X___ 1. Do not eat food or drink liquids after midnight. No gum chewing or hard candies.     _X___ 2. No Alcohol for 24 hours before or after surgery.   ____ 3. Bring all medications with you on the day of surgery if instructed.    _X___ 4. Notify your doctor if there is any change in your medical condition     (cold, fever, infections).     Do not wear jewelry, make-up, hairpins, clips or nail polish.  Do not wear lotions, powders, or perfumes. You may wear deodorant.  Do not shave 48 hours prior to surgery. Men may shave face and neck.  Do not bring valuables to the hospital.    [facility] is not responsible for any belongings or valuables.               Contacts, dentures or bridgework may not be worn into surgery.  Leave your suitcase in the car. After surgery it may be brought to your room.  For patients admitted to the hospital, discharge time is determined by your treatment team.   Patients discharged the day of surgery will not be allowed to drive home.   Please read over the following fact sheets that you were given:     _X___ Take these medicines the morning of surgery with A SIP OF WATER:    1. LEVOTHYROXINE  2.   3.   4.  5.  6.  ____ Fleet Enema (as directed)   _X___ Use CHG Soap as directed  ____ Use inhalers on the day of surgery  ____ Stop metformin 2 days prior to surgery    ____ Take 1/2 of usual insulin dose the night before surgery and none on the morning of surgery.   ____ Stop Coumadin/Plavix/aspirin-N/A  ____ Stop  Anti-inflammatories   ____ Stop supplements until after surgery.    ____ Bring C-Pap to the hospital.

## 2016-02-21 ENCOUNTER — Encounter
Admission: RE | Admit: 2016-02-21 | Discharge: 2016-02-21 | Disposition: A | Discharge status: Home / Self Care | Payer: Medicare Other | Source: Ambulatory Visit | Attending: General Surgery | Admitting: General Surgery

## 2016-02-21 DIAGNOSIS — Z01810 Encounter for preprocedural cardiovascular examination: Secondary | ICD-10-CM | POA: Diagnosis not present

## 2016-02-22 ENCOUNTER — Ambulatory Visit (INDEPENDENT_AMBULATORY_CARE_PROVIDER_SITE_OTHER): Payer: Medicare Other | Admitting: Family Medicine

## 2016-02-22 VITALS — BP 136/68 | HR 109 | Temp 98.1°F | Resp 16 | Ht 73.0 in | Wt 185.0 lb

## 2016-02-22 DIAGNOSIS — E034 Atrophy of thyroid (acquired): Secondary | ICD-10-CM | POA: Diagnosis not present

## 2016-02-22 DIAGNOSIS — R7309 Other abnormal glucose: Secondary | ICD-10-CM | POA: Insufficient documentation

## 2016-02-22 DIAGNOSIS — E038 Other specified hypothyroidism: Secondary | ICD-10-CM

## 2016-02-22 DIAGNOSIS — Z0000 Encounter for general adult medical examination without abnormal findings: Secondary | ICD-10-CM | POA: Diagnosis not present

## 2016-02-22 DIAGNOSIS — R7303 Prediabetes: Secondary | ICD-10-CM | POA: Diagnosis not present

## 2016-02-22 NOTE — Assessment & Plan Note (Signed)
Pt will send lab work with most recent TSH.

## 2016-02-22 NOTE — Patient Instructions (Signed)

## 2016-02-22 NOTE — Progress Notes (Signed)
Subjective:    Patient ID: Samuel [name], male    DOB: January 29, 1951, 65 y.o.   MRN: [id number]  HPI: Samuel [name] is a 65 y.o. male presenting on 02/22/2016 for Annual Exam   HPI  Pt presents for annual exam. Doing well. Last A1c 5.8%- up a little from his last labs.  Exercise- resistance and cardio exercise. 4-5 times per week.  Mild cough.  Screened PSA in his labs.  Thyroid: No cold intolerance. Not feeling sluggish. No changes in bowel or bladder.   Scheduled for inguinal hernia surgery on Thursday. Repaired R in July, repairing L.  Past Medical History  Diagnosis Date  . Basal cell carcinoma of face 2000  . Hypothyroidism   . Anemia     h/o  . Colon polyp    Social History   Social History  . Marital Status: Divorced    Spouse Name: N/A  . Number of Children: N/A  . Years of Education: N/A   Occupational History  . Not on file.   Social History Main Topics  . Smoking status: Never Smoker   . Smokeless tobacco: Never Used  . Alcohol Use: No  . Drug Use: No  . Sexual Activity: Not on file   Other Topics Concern  . Not on file   Social History Narrative   Family History  Problem Relation Age of Onset  . Cancer Mother     uterine   Current Outpatient Prescriptions on File Prior to Visit  Medication Sig  . MULTIPLE VITAMIN PO Take 1 tablet by mouth every morning.  Marland Kitchen SYNTHROID 112 MCG tablet TAKE ONE (1) TABLET EACH DAY   No current facility-administered medications on file prior to visit.    Review of Systems  Constitutional: Negative for fever and chills.  HENT: Negative.   Respiratory: Negative for chest tightness, shortness of breath and wheezing.   Cardiovascular: Negative for chest pain, palpitations and leg swelling.  Gastrointestinal: Negative for nausea, vomiting and abdominal pain.  Endocrine: Negative.   Genitourinary: Negative for dysuria, urgency, discharge, penile pain and testicular pain.  Musculoskeletal: Negative for back pain,  joint swelling and arthralgias.  Skin: Negative.   Neurological: Negative for dizziness, weakness, numbness and headaches.  Psychiatric/Behavioral: Negative for sleep disturbance and dysphoric mood.   Per HPI unless specifically indicated above     Objective:    BP 136/68 mmHg  Pulse 109  Temp(Src) 98.1 F (36.7 C) (Oral)  Resp 16  Ht 6\' 1"  (1.854 m)  Wt 185 lb (83.915 kg)  BMI 24.41 kg/m2  Wt Readings from Last 3 Encounters:  02/22/16 185 lb (83.915 kg)  02/07/16 184 lb (83.462 kg)  09/11/15 181 lb (82.101 kg)    Physical Exam  Constitutional: He is oriented to person, place, and time. He appears well-developed and well-nourished. No distress.  HENT:  Head: Normocephalic and atraumatic.  Neck: Neck supple. No thyromegaly present.  Cardiovascular: Normal rate, regular rhythm and normal heart sounds.  Exam reveals no gallop and no friction rub.   No murmur heard. Pulmonary/Chest: Effort normal and breath sounds normal. He has no wheezes.  Abdominal: Soft. Bowel sounds are normal. He exhibits no distension. There is no tenderness. There is no rebound.  Musculoskeletal: Normal range of motion. He exhibits no edema or tenderness.  Neurological: He is alert and oriented to person, place, and time. He has normal reflexes.  Skin: Skin is warm and dry. No rash noted. No erythema.  Scattered sebhorrheic  keratosis. Scattered nevi.   Psychiatric: He has a normal mood and affect. His behavior is normal. Thought content normal.   No results found for this or any previous visit.    Assessment & Plan:   Problem List Items Addressed This Visit      Endocrine   Hypothyroidism due to acquired atrophy of thyroid    Pt will send lab work with most recent TSH.         Other   Prediabetes    Last A1c increased to 5.8%. Discussed diet and lifestyle interventions and diet changes. Pt has self reduced carbohydrates.  Recheck in 3 mos.        Other Visit Diagnoses    Preventative  health care    -  Primary    Reviewed preventative maintenance. Needs prevnar.        No orders of the defined types were placed in this encounter.      Follow up plan: Return in about 3 months (around 05/24/2016) for prediabetes.

## 2016-02-22 NOTE — Assessment & Plan Note (Signed)
Last A1c increased to 5.8%. Discussed diet and lifestyle interventions and diet changes. Pt has self reduced carbohydrates.  Recheck in 3 mos.

## 2016-02-25 NOTE — Pre-Procedure Instructions (Signed)
SPOKE WITH DR Rosey Bath ABOUT EKG THAT SHOWED ST ABNORMALITY, POSSIBLE DIGITALIS EFFECT- MD INFORMED THAT PT IS VERY HEALTHY, ONLY TAKING SYNTHROID, AND WE DID THE EKG DUE TO HIS AGE.  ALSO INFORMED DR Rosey Bath THAT THIS PT WAS VERY HAIRY AND HAD A HARD TIME WITH THE ELECTRODES.  DR Rosey Bath SAID THAT WE DID NOT NEED ANY CLEARANCE AND PT IS OK TO PROCEED

## 2016-02-27 ENCOUNTER — Encounter: Payer: Self-pay | Admitting: Family Medicine

## 2016-02-27 ENCOUNTER — Telehealth: Payer: Self-pay | Admitting: Family Medicine

## 2016-02-27 DIAGNOSIS — E034 Atrophy of thyroid (acquired): Secondary | ICD-10-CM

## 2016-02-27 MED ORDER — SYNTHROID 112 MCG PO TABS: 112.0000 ug | ORAL_TABLET | Freq: Every day | ORAL | Status: DC

## 2016-02-27 NOTE — Telephone Encounter (Signed)
Called pt to determine if he needs [facility]ill [facility]

## 2016-02-28 ENCOUNTER — Encounter
Admission: RE | Disposition: A | Discharge status: Home / Self Care | Payer: Self-pay | Source: Ambulatory Visit | Attending: General Surgery

## 2016-02-28 ENCOUNTER — Ambulatory Visit: Payer: Medicare Other | Admitting: Anesthesiology

## 2016-02-28 ENCOUNTER — Ambulatory Visit
Admission: RE | Admit: 2016-02-28 | Discharge: 2016-02-28 | Disposition: A | Discharge status: Home / Self Care | Payer: Medicare Other | Source: Ambulatory Visit | Attending: General Surgery | Admitting: General Surgery

## 2016-02-28 ENCOUNTER — Encounter: Payer: Self-pay | Admitting: *Deleted

## 2016-02-28 DIAGNOSIS — K4090 Unilateral inguinal hernia, without obstruction or gangrene, not specified as recurrent: Secondary | ICD-10-CM

## 2016-02-28 DIAGNOSIS — Z9841 Cataract extraction status, right eye: Secondary | ICD-10-CM | POA: Insufficient documentation

## 2016-02-28 DIAGNOSIS — Z85828 Personal history of other malignant neoplasm of skin: Secondary | ICD-10-CM | POA: Insufficient documentation

## 2016-02-28 DIAGNOSIS — Z8049 Family history of malignant neoplasm of other genital organs: Secondary | ICD-10-CM | POA: Insufficient documentation

## 2016-02-28 DIAGNOSIS — Z86010 Personal history of colonic polyps: Secondary | ICD-10-CM | POA: Diagnosis not present

## 2016-02-28 DIAGNOSIS — E890 Postprocedural hypothyroidism: Secondary | ICD-10-CM | POA: Insufficient documentation

## 2016-02-28 DIAGNOSIS — D649 Anemia, unspecified: Secondary | ICD-10-CM | POA: Insufficient documentation

## 2016-02-28 DIAGNOSIS — Z79899 Other long term (current) drug therapy: Secondary | ICD-10-CM | POA: Insufficient documentation

## 2016-02-28 HISTORY — PX: INGUINAL HERNIA REPAIR: SHX194

## 2016-02-28 SURGERY — REPAIR, HERNIA, INGUINAL, ADULT
Anesthesia: General | Laterality: Left | Wound class: Clean

## 2016-02-28 MED ORDER — OXYCODONE HCL 5 MG PO TABS: 5.0000 mg | ORAL_TABLET | Freq: Once | ORAL | Status: DC | PRN

## 2016-02-28 MED ORDER — BUPIVACAINE-EPINEPHRINE (PF) 0.5% -1:200000 IJ SOLN
INTRAMUSCULAR | Status: AC
  Filled 2016-02-28: qty 30

## 2016-02-28 MED ORDER — PROPOFOL 10 MG/ML IV BOLUS
INTRAVENOUS | Status: DC | PRN
  Administered 2016-02-28: 200 mg via INTRAVENOUS

## 2016-02-28 MED ORDER — LIDOCAINE HCL (PF) 2 % IJ SOLN
INTRAMUSCULAR | Status: DC | PRN
  Administered 2016-02-28: 50 mg

## 2016-02-28 MED ORDER — KETOROLAC TROMETHAMINE 30 MG/ML IJ SOLN
INTRAMUSCULAR | Status: DC | PRN
  Administered 2016-02-28: 30 mg via INTRAMUSCULAR

## 2016-02-28 MED ORDER — HYDROCODONE-ACETAMINOPHEN 5-325 MG PO TABS: 1.0000 | ORAL_TABLET | ORAL | Status: DC | PRN

## 2016-02-28 MED ORDER — FAMOTIDINE 20 MG PO TABS
20.0000 mg | ORAL_TABLET | Freq: Once | ORAL | Status: AC
  Administered 2016-02-28: 20 mg via ORAL

## 2016-02-28 MED ORDER — OXYCODONE HCL 5 MG/5ML PO SOLN: 5.0000 mg | Freq: Once | ORAL | Status: DC | PRN

## 2016-02-28 MED ORDER — CEFAZOLIN SODIUM-DEXTROSE 2-3 GM-% IV SOLR
INTRAVENOUS | Status: DC | PRN
  Administered 2016-02-28: 2 g via INTRAVENOUS

## 2016-02-28 MED ORDER — FENTANYL CITRATE (PF) 100 MCG/2ML IJ SOLN: 25.0000 ug | INTRAMUSCULAR | Status: DC | PRN

## 2016-02-28 MED ORDER — ONDANSETRON HCL 4 MG/2ML IJ SOLN
INTRAMUSCULAR | Status: DC | PRN
  Administered 2016-02-28: 4 mg via INTRAVENOUS

## 2016-02-28 MED ORDER — BUPIVACAINE-EPINEPHRINE (PF) 0.5% -1:200000 IJ SOLN
INTRAMUSCULAR | Status: DC | PRN
  Administered 2016-02-28: 30 mL

## 2016-02-28 MED ORDER — FAMOTIDINE 20 MG PO TABS
ORAL_TABLET | ORAL | Status: AC
  Filled 2016-02-28: qty 1

## 2016-02-28 MED ORDER — LACTATED RINGERS IV SOLN
INTRAVENOUS | Status: DC
  Administered 2016-02-28: 06:00:00 via INTRAVENOUS

## 2016-02-28 MED ORDER — GLYCOPYRROLATE 0.2 MG/ML IJ SOLN
INTRAMUSCULAR | Status: DC | PRN
  Administered 2016-02-28: 0.2 mg via INTRAVENOUS

## 2016-02-28 MED ORDER — FENTANYL CITRATE (PF) 100 MCG/2ML IJ SOLN
INTRAMUSCULAR | Status: DC | PRN
  Administered 2016-02-28 (×4): 50 ug via INTRAVENOUS

## 2016-02-28 MED ORDER — MIDAZOLAM HCL 5 MG/5ML IJ SOLN
INTRAMUSCULAR | Status: DC | PRN
  Administered 2016-02-28: 2 mg via INTRAVENOUS

## 2016-02-28 SURGICAL SUPPLY — 35 items
BENZOIN TINCTURE PRP APPL 2/3 (GAUZE/BANDAGES/DRESSINGS) ×3 IMPLANT
BLADE SURG 15 STRL SS SAFETY (BLADE) ×3 IMPLANT
CANISTER SUCT 1200ML W/VALVE (MISCELLANEOUS) ×3 IMPLANT
CHLORAPREP W/TINT 26ML (MISCELLANEOUS) ×3 IMPLANT
CLOSURE WOUND 1/2 X4 (GAUZE/BANDAGES/DRESSINGS) ×1
DECANTER SPIKE VIAL GLASS SM (MISCELLANEOUS) ×3 IMPLANT
DRAIN PENROSE 1/4X12 LTX (DRAIN) ×3 IMPLANT
DRAPE LAPAROTOMY 100X77 ABD (DRAPES) ×3 IMPLANT
DRESSING TELFA 4X3 1S ST N-ADH (GAUZE/BANDAGES/DRESSINGS) ×3 IMPLANT
DRSG TEGADERM 4X4.75 (GAUZE/BANDAGES/DRESSINGS) ×3 IMPLANT
ELECT REM PT RETURN 9FT ADLT (ELECTROSURGICAL) ×3
ELECTRODE REM PT RTRN 9FT ADLT (ELECTROSURGICAL) ×1 IMPLANT
GLOVE BIO SURGEON STRL SZ7.5 (GLOVE) ×9 IMPLANT
GLOVE INDICATOR 8.0 STRL GRN (GLOVE) ×6 IMPLANT
GOWN STRL REUS W/ TWL LRG LVL3 (GOWN DISPOSABLE) ×2 IMPLANT
GOWN STRL REUS W/TWL LRG LVL3 (GOWN DISPOSABLE) ×4
KIT RM TURNOVER STRD PROC AR (KITS) ×3 IMPLANT
LABEL OR SOLS (LABEL) IMPLANT
MESH HERNIA 6X12 ULTRAPRO MED (Mesh General) ×1 IMPLANT
MESH HERNIA ULTRAPRO MED (Mesh General) ×2 IMPLANT
NDL SAFETY 22GX1.5 (NEEDLE) ×6 IMPLANT
NEEDLE HYPO 25X1 1.5 SAFETY (NEEDLE) IMPLANT
PACK BASIN MINOR ARMC (MISCELLANEOUS) ×3 IMPLANT
STRIP CLOSURE SKIN 1/2X4 (GAUZE/BANDAGES/DRESSINGS) ×2 IMPLANT
SUT SURGILON 0 BLK (SUTURE) ×3 IMPLANT
SUT VIC AB 2-0 SH 27 (SUTURE) ×2
SUT VIC AB 2-0 SH 27XBRD (SUTURE) ×1 IMPLANT
SUT VIC AB 3-0 54X BRD REEL (SUTURE) ×1 IMPLANT
SUT VIC AB 3-0 BRD 54 (SUTURE) ×2
SUT VIC AB 3-0 SH 27 (SUTURE) ×2
SUT VIC AB 3-0 SH 27X BRD (SUTURE) ×1 IMPLANT
SUT VIC AB 4-0 FS2 27 (SUTURE) ×3 IMPLANT
SWABSTK COMLB BENZOIN TINCTURE (MISCELLANEOUS) IMPLANT
SYR 3ML LL SCALE MARK (SYRINGE) ×3 IMPLANT
SYR CONTROL 10ML (SYRINGE) ×3 IMPLANT

## 2016-02-28 NOTE — Anesthesia Procedure Notes (Signed)
Procedure Name: LMA Insertion Performed by: Lum Stillinger Pre-anesthesia Checklist: Patient identified, Patient being monitored, Timeout performed, Emergency Drugs available and Suction available Patient Re-evaluated:Patient Re-evaluated prior to inductionOxygen Delivery Method: Circle system utilized Preoxygenation: Pre-oxygenation with 100% oxygen Intubation Type: IV induction Ventilation: Mask ventilation without difficulty LMA: LMA inserted LMA Size: 5.0 Tube type: Oral Number of attempts: 1 Placement Confirmation: positive ETCO2 and breath sounds checked- equal and bilateral Tube secured with: Tape Dental Injury: Teeth and Oropharynx as per pre-operative assessment      

## 2016-02-28 NOTE — Discharge Instructions (Signed)

## 2016-02-28 NOTE — H&P (Signed)
No change in clinical condition since February exam.

## 2016-02-28 NOTE — Transfer of Care (Signed)
Immediate Anesthesia Transfer of Care Note  Patient: [name]  Procedure(s) Performed: Procedure(s): HERNIA REPAIR INGUINAL ADULT (Left)  Patient Location: PACU  Anesthesia Type:General  Level of Consciousness: sedated  Airway & Oxygen Therapy: Patient Spontanous Breathing and Patient connected to face mask oxygen  Post-op Assessment: Report given to RN  Post vital signs: Reviewed  Last Vitals:  Filed Vitals:   02/28/16 0554 02/28/16 0830  BP: 166/87 91/59  Pulse: 80 75  Temp: 36.4 C 36.7 C  Resp: 16 11    Complications: No apparent anesthesia complications

## 2016-02-28 NOTE — Anesthesia Postprocedure Evaluation (Signed)
Anesthesia Post Note  Patient: MUSTAFA BELSHAW  Procedure(s) Performed: Procedure(s) (LRB): HERNIA REPAIR INGUINAL ADULT (Left)  Patient location during evaluation: PACU Anesthesia Type: General Level of consciousness: awake and alert Pain management: pain level controlled Vital Signs Assessment: post-procedure vital signs reviewed and stable Respiratory status: spontaneous breathing, nonlabored ventilation, respiratory function stable and patient connected to nasal cannula oxygen Cardiovascular status: blood pressure returned to baseline and stable Postop Assessment: no signs of nausea or vomiting Anesthetic complications: no    Last Vitals:  Filed Vitals:   02/28/16 0900 02/28/16 0915  BP: 123/76 126/86  Pulse: 76 77  Temp:  36.6 C  Resp: 21 19    Last Pain: There were no vitals filed for this visit.               Precious Haws [place]

## 2016-02-28 NOTE — Anesthesia Preprocedure Evaluation (Signed)
Anesthesia Evaluation  Patient identified by MRN, date of birth, ID band Patient awake    Reviewed: Allergy & Precautions, H&P , NPO status , Patient's Chart, lab work & pertinent test results  History of Anesthesia Complications Negative for: history of anesthetic complications  Airway Mallampati: III  TM Distance: >3 FB Neck ROM: limited    Dental  (+) Poor Dentition   Pulmonary neg pulmonary ROS, neg shortness of breath,    Pulmonary exam normal breath sounds clear to auscultation       Cardiovascular Exercise Tolerance: Good (-) angina(-) Past MI and (-) DOE negative cardio ROS Normal cardiovascular exam Rhythm:regular Rate:Normal     Neuro/Psych negative neurological ROS  negative psych ROS   GI/Hepatic negative GI ROS, Neg liver ROS,   Endo/Other  Hypothyroidism   Renal/GU negative Renal ROS  negative genitourinary   Musculoskeletal   Abdominal   Peds  Hematology negative hematology ROS (+)   Anesthesia Other Findings Past Medical History:   Basal cell carcinoma of face                    2000         Hypothyroidism                                               Anemia                                                         Comment:h/o   Colon polyp                                                 Past Surgical History:   TONSILLECTOMY                                    1956         CATARACT EXTRACTION                             Right 2009         BIOPSY THYROID                                  Left 1993         BASAL CELL CARCINOMA EXCISION                    2000           Comment:face   DENTAL SURGERY                                                THYROID LOBECTOMY  Left 1993           Comment:benign   dental implant                                   08-03-15      INGUINAL HERNIA REPAIR                          Right 08/08/2015      Comment:Procedure: HERNIA REPAIR  INGUINAL ADULT;                Surgeon: Robert Bellow, MD;  Location: [facility]              ORS;  Service: General;  Laterality: Right;   COLONOSCOPY                                      2011        BMI    Body Mass Index   24.41 kg/m 2      Reproductive/Obstetrics negative OB ROS                             Anesthesia Physical Anesthesia Plan  ASA: III  Anesthesia Plan: General LMA   Post-op Pain Management:    Induction:   Airway Management Planned:   Additional Equipment:   Intra-op Plan:   Post-operative Plan:   Informed Consent: I have reviewed the patients History and Physical, chart, labs and discussed the procedure including the risks, benefits and alternatives for the proposed anesthesia with the patient or authorized representative who has indicated his/her understanding and acceptance.   Dental Advisory Given  Plan Discussed with: Anesthesiologist, CRNA and Surgeon  Anesthesia Plan Comments:         Anesthesia Quick Evaluation

## 2016-02-28 NOTE — Op Note (Signed)
Preoperative diagnosis: Left inguinal hernia.  Postoperative diagnosis: Same, indirect.  Operative procedure: Left inguinal hernia repair with medium Ultra Pro mesh.  Operating surgeon: Hervey Ard, M.D.  Anesthesia: Gen. by LMA, Marcaine 0.5% with 1-200,000 units of epinephrine, 30 mL; Toradol 30 mg.  Estimated blood loss: Less than 5 mL.  Clinical note: This 65 year old male recently had an episode of bronchitis with a two-month history of coughing. He developed asymptomatic left inguinal hernia. Previous contralateral right inguinal repair has been intact.  He is admitted for elective repair.  Operative note: The patient received Kefzol 2 g intravenously on induction of anesthesia. Harriett previously been removed with clippers. The abdomen was prepped with ChloraPrep and draped. A 5 cm skin line incision along the anticipated course of the inguinal canal was carried down to the skin is obtain his tissue with hemostasis achieved by electrocautery. The external oblique was opened in direction of its fibers. The ilioinguinal inguinal nerve was identified and protected. The iliohypogastric nerve was not appreciated. There was marked inflammatory changes in the inguinal canal insistent with edematous tissue and increased vascularity. The vas and vessels as well as indirect sac were mobilized. The sac was freed into the preperitoneal space. A medium ultra Pro mesh was smoothed into position and the external component along the floor of the inguinal canal. A lateral slit was made for cord passage and closed with interrupted 0 Surgilon sutures. The mesh was anchored to the pubic tubercle with 0 Surgilon and then along inguinal ligament with interrupted 0 Surgilon sutures. The medial and superior borders were anchored to the transverse abdominis aponeurosis in a similar fashion. Toradol was placed in the wound. The vas and vessels were returned to their bed and the external Blake closed with a running  2-0 Vicryl suture. Scarpa's fascia was closed with a running 3-0 Vicryl suture and the skin closed with running 4-0 Vicryl septic suture. Benzoin, Steri-Strips, Telfa and Tegaderm dressing was applied.  The patient tolerated the procedure well and was taken to the recovery room in stable condition.

## 2016-03-06 ENCOUNTER — Ambulatory Visit (INDEPENDENT_AMBULATORY_CARE_PROVIDER_SITE_OTHER): Payer: Medicare Other | Admitting: General Surgery

## 2016-03-06 ENCOUNTER — Encounter: Payer: Self-pay | Admitting: General Surgery

## 2016-03-06 VITALS — BP 120/74 | HR 74 | Resp 12 | Ht 73.0 in | Wt 195.0 lb

## 2016-03-06 DIAGNOSIS — K4090 Unilateral inguinal hernia, without obstruction or gangrene, not specified as recurrent: Secondary | ICD-10-CM

## 2016-03-06 NOTE — Progress Notes (Signed)
Patient ID: Samuel [name], male   DOB: Jul 05, 1951, 65 y.o.   MRN: [id number]  Chief Complaint  Patient presents with  . Routine Post Op    hernia    HPI Samuel [name] is a 65 y.o. male here today for his post op left inguinal hernia done on 02/28/16.He states he is doing well.   No difficulty with bowel or bladder function. 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  I personally reviewed the patient's history. HPI  Past Medical History  Diagnosis Date  . Basal cell carcinoma of face 2000  . Hypothyroidism   . Anemia     h/o  . Colon polyp     Past Surgical History  Procedure Laterality Date  . Tonsillectomy  1956  . Cataract extraction Right 2009  . Biopsy thyroid Left 1993  . Basal cell carcinoma excision  2000    face  . Dental surgery    . Thyroid lobectomy Left 1993    benign  . Dental implant  08-03-15  . Inguinal hernia repair Right 08/08/2015    Procedure: HERNIA REPAIR INGUINAL ADULT;  Surgeon: Robert Bellow, MD;  Location: [facility] ORS;  Service: General;  Laterality: Right;  . Colonoscopy  2011  . Inguinal hernia repair Left 02/28/2016    Procedure: HERNIA REPAIR INGUINAL ADULT;  Surgeon: Robert Bellow, MD;  Location: [facility] ORS;  Service: General;  Laterality: Left;    Family History  Problem Relation Age of Onset  . Cancer Mother     uterine    Social History Social History  Substance Use Topics  . Smoking status: Never Smoker   . Smokeless tobacco: Never Used  . Alcohol Use: No    No Known Allergies  Current Outpatient Prescriptions  Medication Sig Dispense Refill  . MULTIPLE VITAMIN PO Take 1 tablet by mouth every morning.    Marland Kitchen SYNTHROID 112 MCG tablet Take 1 tablet (112 mcg total) by mouth [name] before breakfast. 90 tablet 3   No current facility-administered medications for this visit.    Review of Systems Review of Systems  Constitutional: Negative.   Respiratory: Negative.   Cardiovascular: Negative.      Blood pressure 120/74, pulse 74, resp. rate 12, height 6\' 1"  (1.854 m), weight 195 lb (88.451 kg).  Physical Exam Physical Exam  Constitutional: He is oriented to person, place, and time. He appears well-developed and well-nourished.  Abdominal:  Left inguinal incision is clean and healing well.   Genitourinary:     Neurological: He is alert and oriented to person, place, and time.  Skin: Skin is warm and dry.       Assessment    Doing well status post left inguinal hernia repair.    Plan    The patient is well versed in proper lifting technique. He'll increase his activity as tolerated.   Patient to return as needed.  PCP:  Luan Pulling  This information has been scribed by Gaspar Cola CMA.    Robert Bellow 03/07/2016, 8:42 AM

## 2016-10-10 ENCOUNTER — Encounter: Payer: Self-pay | Admitting: Family Medicine

## 2016-10-10 LAB — CBC WITH DIFFERENTIAL
BASO%: 1 %
Basophils Absolute: 0 /uL
EOS%: 4 %
Eosinophils Absolute: 0 /uL
HCT: 41.1
HGB: 14.3 g/dL
LYMPH%: 27 %
LYMPHO ABS: 1 /uL
MCH: 30.2
MCHC: 34.8
MCV: 87
MONOS PCT: 10
Monocyte #: 0.5
NEUTROS ABS: 2.8
Neutrophil %: 58
PLATELETS: 230
RBC: 4.74
RDW: 13.4
WBC: 4.8

## 2016-10-10 LAB — IRON AND TIBC
IRON SATURATION: 32 (ref 15–55)
Iron: 104 (ref 38–169)
TIBC: 323 (ref 250–450)
UIBC: 219 (ref 111–343)

## 2016-10-10 LAB — TSH: TSH: 0.659 (ref 0.450–4.50)

## 2016-10-10 LAB — GLUCOSE, FASTING: Glucose: 98 (ref 65–99)

## 2016-10-10 LAB — LIPID PANEL
Cholesterol, Total: 163
HDL: 70
LDL Cholesterol: 81 mg/dL
LDL/HDL RATIO: 1.2
Triglycerides: 58
VLDL: 12 mg/dL

## 2016-10-10 LAB — HEMOGLOBIN A1C: HEMOGLOBIN A1C: 5.4

## 2016-10-28 ENCOUNTER — Ambulatory Visit (INDEPENDENT_AMBULATORY_CARE_PROVIDER_SITE_OTHER): Payer: Medicare Other | Admitting: General Surgery

## 2016-10-28 ENCOUNTER — Encounter: Payer: Self-pay | Admitting: General Surgery

## 2016-10-28 VITALS — BP 124/68 | HR 74 | Resp 12 | Ht 73.0 in | Wt 181.0 lb

## 2016-10-28 DIAGNOSIS — Z86010 Personal history of colonic polyps: Secondary | ICD-10-CM | POA: Diagnosis not present

## 2016-10-28 MED ORDER — POLYETHYLENE GLYCOL 3350 17 GM/SCOOP PO POWD
1.0000 | Freq: Once | ORAL | 0 refills | Status: AC
Start: 2016-10-28 — End: 2016-10-28

## 2016-10-28 NOTE — Progress Notes (Signed)
Dictation #1 [id number]  [id number] Patient ID: Samuel [name], male   DOB: 1951-12-11, 64 y.o.   MRN: [id number]  Chief Complaint  Patient presents with  . Colonoscopy    HPI Samuel [name] is a 65 y.o. male Here today for a evaluation of a screening colonoscopy.last colonoscopy was done in 2011. Patient states no GI problems at this time.  The patient reports doing well since his most recent hernia repair completed earlier this year.  HPI  Past Medical History:  Diagnosis Date  . Anemia    h/o  . Basal cell carcinoma of face 2000  . Colon polyp   . Hypothyroidism     Past Surgical History:  Procedure Laterality Date  . BASAL CELL CARCINOMA EXCISION  2000   face  . BIOPSY THYROID Left 1993  . CATARACT EXTRACTION Right 2009  . COLONOSCOPY  2011  . dental implant  08-03-15  . DENTAL SURGERY    . INGUINAL HERNIA REPAIR Right 08/08/2015   Procedure: HERNIA REPAIR INGUINAL ADULT;  Surgeon: Robert Bellow, MD;  Location: [facility] ORS;  Service: General;  Laterality: Right;  . INGUINAL HERNIA REPAIR Left 02/28/2016   Procedure: HERNIA REPAIR INGUINAL ADULT;  Surgeon: Robert Bellow, MD;  Location: [facility] ORS;  Service: General;  Laterality: Left;  . THYROID LOBECTOMY Left 1993   benign  . TONSILLECTOMY  1956    Family History  Problem Relation Age of Onset  . Cancer Mother     uterine    Social History Social History  Substance Use Topics  . Smoking status: Never Smoker  . Smokeless tobacco: Never Used  . Alcohol use No    No Known Allergies  Current Outpatient Prescriptions  Medication Sig Dispense Refill  . MULTIPLE VITAMIN PO Take 1 tablet by mouth every morning.    Marland Kitchen SYNTHROID 112 MCG tablet Take 1 tablet (112 mcg total) by mouth daily before breakfast. 90 tablet 3  . polyethylene glycol powder (GLYCOLAX/MIRALAX) powder Take 255 g by mouth once. 255 g 0   No current facility-administered medications for this visit.     Review of Systems Review of  Systems  Constitutional: Negative.   Respiratory: Negative.   Cardiovascular: Negative.     Blood pressure 124/68, pulse 74, resp. rate 12, height 6\' 1"  (1.854 m), weight 181 lb (82.1 kg).  Physical Exam Physical Exam  Constitutional: He is oriented to person, place, and time. He appears well-developed and well-nourished.  Eyes: Conjunctivae are normal. No scleral icterus.  Neck: Neck supple.  Cardiovascular: Normal rate, regular rhythm and normal heart sounds.   Pulmonary/Chest: Effort normal and breath sounds normal.  Abdominal: Soft. Bowel sounds are normal. There is no tenderness.  Lymphadenopathy:    He has no cervical adenopathy.  Neurological: He is alert and oriented to person, place, and time.  Skin: Skin is warm and dry.    Data Reviewed Colonoscopy dated 12/20/2010 completed by Lacinda Axon, M.D. at the [facility] reported a 2 mm cecal polyp. Pathology showed a tubular adenoma without high-grade dysplasia.  Assessment    Personal history colonic polyp.    Plan         Colonoscopy with possible biopsy/polypectomy prn: Information regarding the procedure, including its potential risks and complications (including but not limited to perforation of the bowel, which may require emergency surgery to repair, and bleeding) was verbally given to the patient. Educational information regarding lower intestinal endoscopy was given to the patient. Written instructions  for how to complete the bowel prep using Miralax were provided. The importance of drinking ample fluids to avoid dehydration as a result of the prep emphasized.  The patient is scheduled for a Colonoscopy at [facility] on 12/03/16. They are aware to call the day before to get their arrival time. He will stop his Fish Oil one week prior. Miralax prescription has been sent into the patient's pharmacy. The patient is aware of date and instructions.    This information has been scribed by Gaspar Cola  CMA.    Robert Bellow 10/28/2016, 9:09 PM

## 2016-10-28 NOTE — Patient Instructions (Addendum)
Colonoscopy A colonoscopy is an exam to look at the entire large intestine (colon). This exam can help find problems such as tumors, polyps, inflammation, and areas of bleeding. The exam takes about 1 hour.  LET [facility] CARE PROVIDER KNOW ABOUT:   Any allergies you have.  All medicines you are taking, including vitamins, herbs, eye drops, creams, and over-the-counter medicines.  Previous problems you or members of your family have had with the use of anesthetics.  Any blood disorders you have.  Previous surgeries you have had.  Medical conditions you have. RISKS AND COMPLICATIONS  Generally, this is a safe procedure. However, as with any procedure, complications can occur. Possible complications include:  Bleeding.  Tearing or rupture of the colon wall.  Reaction to medicines given during the exam.  Infection (rare). BEFORE THE PROCEDURE   Ask your health care provider about changing or stopping your regular medicines.  You may be prescribed an oral bowel prep. This involves drinking a large amount of medicated liquid, starting the day before your procedure. The liquid will cause you to have multiple loose stools until your stool is almost clear or light green. This cleans out your colon in preparation for the procedure.  Do not eat or drink anything else once you have started the bowel prep, unless your health care provider tells you it is safe to do so.  Arrange for someone to drive you home after the procedure. PROCEDURE   You will be given medicine to help you relax (sedative).  You will lie on your side with your knees bent.  A long, flexible tube with a light and camera on the end (colonoscope) will be inserted through the rectum and into the colon. The camera sends video back to a computer screen as it moves through the colon. The colonoscope also releases carbon dioxide gas to inflate the colon. This helps your health care provider see the area better.  During  the exam, your health care provider may take a small tissue sample (biopsy) to be examined under a microscope if any abnormalities are found.  The exam is finished when the entire colon has been viewed. AFTER THE PROCEDURE   Do not drive for 24 hours after the exam.  You may have a small amount of blood in your stool.  You may pass moderate amounts of gas and have mild abdominal cramping or bloating. This is caused by the gas used to inflate your colon during the exam.  Ask when your test results will be ready and how you will get your results. Make sure you get your test results.   This information is not intended to replace advice given to you by your health care provider. Make sure you discuss any questions you have with your health care provider.   Document Released: 12/05/2000 Document Revised: 09/28/2013 Document Reviewed: 08/15/2013 Elsevier Interactive Patient Education [employer].  The patient is scheduled for a Colonoscopy at [facility] on 12/03/16. They are aware to call the day before to get their arrival time. He will stop his Fish Oil one week prior. Miralax prescription has been sent into the patient's pharmacy. The patient is aware of date and instructions.

## 2016-11-27 ENCOUNTER — Telehealth: Payer: Self-pay | Admitting: *Deleted

## 2016-11-27 NOTE — Telephone Encounter (Signed)
Message left on home and cell numbers for patient to call the office.   He is scheduled for a colonoscopy on 12-03-16 at [facility].   We need to find out if patient has had any changes in medications since last office visit. Also, need to make sure he has Miralax prescription.

## 2016-11-28 NOTE — Telephone Encounter (Signed)
No changes with medication. No questions about prep.

## 2016-12-02 ENCOUNTER — Encounter: Payer: Self-pay | Admitting: *Deleted

## 2016-12-03 ENCOUNTER — Ambulatory Visit: Payer: Medicare Other | Admitting: Anesthesiology

## 2016-12-03 ENCOUNTER — Encounter
Admission: RE | Disposition: A | Discharge status: Home / Self Care | Payer: Self-pay | Source: Ambulatory Visit | Attending: General Surgery

## 2016-12-03 ENCOUNTER — Other Ambulatory Visit: Payer: Self-pay | Admitting: Family Medicine

## 2016-12-03 ENCOUNTER — Ambulatory Visit
Admission: RE | Admit: 2016-12-03 | Discharge: 2016-12-03 | Disposition: A | Discharge status: Home / Self Care | Payer: Medicare Other | Source: Ambulatory Visit | Attending: General Surgery | Admitting: General Surgery

## 2016-12-03 ENCOUNTER — Encounter: Payer: Self-pay | Admitting: *Deleted

## 2016-12-03 DIAGNOSIS — E034 Atrophy of thyroid (acquired): Secondary | ICD-10-CM

## 2016-12-03 DIAGNOSIS — Z1211 Encounter for screening for malignant neoplasm of colon: Secondary | ICD-10-CM | POA: Diagnosis not present

## 2016-12-03 DIAGNOSIS — E039 Hypothyroidism, unspecified: Secondary | ICD-10-CM | POA: Insufficient documentation

## 2016-12-03 DIAGNOSIS — Z86010 Personal history of colonic polyps: Secondary | ICD-10-CM | POA: Diagnosis not present

## 2016-12-03 DIAGNOSIS — Z85828 Personal history of other malignant neoplasm of skin: Secondary | ICD-10-CM | POA: Diagnosis not present

## 2016-12-03 HISTORY — PX: COLONOSCOPY WITH PROPOFOL: SHX5780

## 2016-12-03 SURGERY — COLONOSCOPY WITH PROPOFOL
Anesthesia: General

## 2016-12-03 MED ORDER — FENTANYL CITRATE (PF) 100 MCG/2ML IJ SOLN
INTRAMUSCULAR | Status: DC | PRN
  Administered 2016-12-03: 50 ug via INTRAVENOUS

## 2016-12-03 MED ORDER — PROPOFOL 500 MG/50ML IV EMUL
INTRAVENOUS | Status: DC | PRN
  Administered 2016-12-03: 120 ug/kg/min via INTRAVENOUS

## 2016-12-03 MED ORDER — PROPOFOL 10 MG/ML IV BOLUS
INTRAVENOUS | Status: DC | PRN
  Administered 2016-12-03: 20 mg via INTRAVENOUS
  Administered 2016-12-03: 50 mg via INTRAVENOUS

## 2016-12-03 MED ORDER — SYNTHROID 112 MCG PO TABS: 112.0000 ug | ORAL_TABLET | Freq: Every day | ORAL | 3 refills | Status: DC

## 2016-12-03 MED ORDER — SODIUM CHLORIDE 0.9 % IV SOLN
INTRAVENOUS | Status: DC
  Administered 2016-12-03: 1000 mL via INTRAVENOUS

## 2016-12-03 MED ORDER — PHENYLEPHRINE HCL 10 MG/ML IJ SOLN
INTRAMUSCULAR | Status: DC | PRN
  Administered 2016-12-03 (×2): 100 ug via INTRAVENOUS

## 2016-12-03 MED ORDER — MIDAZOLAM HCL 2 MG/2ML IJ SOLN
INTRAMUSCULAR | Status: DC | PRN
  Administered 2016-12-03: 1 mg via INTRAVENOUS

## 2016-12-03 NOTE — Op Note (Signed)
[facility] Gastroenterology Patient Name: Samuel [name] Procedure Date: 12/03/2016 8:19 AM MRN: [id number] Account #: [id number] Date of Birth: 12-29-1950 Admit Type: Outpatient Age: 65 Room: [facility] ENDO ROOM 1 Gender: Male Note Status: Finalized Procedure:            Colonoscopy Indications:          Screening for colorectal malignant neoplasm Providers:            Robert Bellow, MD Referring MD:         Arlis Porta, MD (Referring MD) Medicines:            Monitored Anesthesia Care Complications:        No immediate complications. Procedure:            Pre-Anesthesia Assessment:                       - Prior to the procedure, a History and Physical was                        performed, and patient medications, allergies and                        sensitivities were reviewed. The patient's tolerance of                        previous anesthesia was reviewed.                       - The risks and benefits of the procedure and the                        sedation options and risks were discussed with the                        patient. All questions were answered and informed                        consent was obtained.                       After obtaining informed consent, the colonoscope was                        passed under direct vision. Throughout the procedure,                        the patient's blood pressure, pulse, and oxygen                        saturations were monitored continuously. The                        Colonoscope was introduced through the anus and                        advanced to the the cecum, identified by appendiceal                        orifice and ileocecal valve. The colonoscopy was  somewhat difficult due to significant looping and a                        tortuous colon. Successful completion of the procedure                        was aided by using manual pressure. The patient            tolerated the procedure well. The quality of the bowel                        preparation was good. Findings:      The entire examined colon appeared normal on direct and retroflexion       views. Impression:           - The entire examined colon is normal on direct and                        retroflexion views.                       - No specimens collected. Recommendation:       - Repeat colonoscopy in 10 years for screening purposes. Procedure Code(s):    --- Professional ---                       [phone], Colonoscopy, flexible; diagnostic, including                        collection of specimen(s) by brushing or washing, when                        performed (separate procedure) Diagnosis Code(s):    --- Professional ---                       Z12.11, Encounter for screening for malignant neoplasm                        of colon CPT copyright 2016 American Medical Association. All rights reserved. The codes documented in this report are preliminary and upon coder review may  be revised to meet current compliance requirements. Robert Bellow, MD 12/03/2016 8:51:54 AM This report has been signed electronically. Number of Addenda: 0 Note Initiated On: 12/03/2016 8:19 AM Scope Withdrawal Time: 0 hours 9 minutes 7 seconds  Total Procedure Duration: 0 hours 25 minutes 55 seconds       [facility]

## 2016-12-03 NOTE — Transfer of Care (Signed)
Immediate Anesthesia Transfer of Care Note  Patient: Samuel [name]  Procedure(s) Performed: Procedure(s): COLONOSCOPY WITH PROPOFOL (N/A)  Patient Location: PACU  Anesthesia Type:General  Level of Consciousness: awake  Airway & Oxygen Therapy: Patient Spontanous Breathing and Patient connected to nasal cannula oxygen  Post-op Assessment: Post -op Vital signs reviewed and stable  Post vital signs: Reviewed  Last Vitals:  Vitals:   12/03/16 0749  BP: (!) 162/82  Pulse: 81  Resp: 20  Temp: (!) 35.6 C    Last Pain:  Vitals:   12/03/16 0749  TempSrc: Tympanic         Complications: No apparent anesthesia complications

## 2016-12-03 NOTE — Anesthesia Postprocedure Evaluation (Signed)
Anesthesia Post Note  Patient: Samuel [name]  Procedure(s) Performed: Procedure(s) (LRB): COLONOSCOPY WITH PROPOFOL (N/A)  Patient location during evaluation: PACU Anesthesia Type: General Level of consciousness: awake Pain management: pain level controlled Vital Signs Assessment: post-procedure vital signs reviewed and stable Respiratory status: nonlabored ventilation Cardiovascular status: stable Anesthetic complications: no    Last Vitals:  Vitals:   12/03/16 0933 12/03/16 0943  BP: 136/89 138/89  Pulse: 62 69  Resp: 13 16  Temp:      Last Pain:  Vitals:   12/03/16 0943  TempSrc:   PainSc: 0-No pain                 VAN STAVEREN,Makaylie Dedeaux

## 2016-12-03 NOTE — H&P (Signed)
     HPI: Candidate for screening colonoscopy. Tolerated prep well.   Prescriptions Prior to Admission  Medication Sig Dispense Refill Last Dose  . MULTIPLE VITAMIN PO Take 1 tablet by mouth every morning.   Past Week at Unknown time  . SYNTHROID 112 MCG tablet Take 1 tablet (112 mcg total) by mouth daily before breakfast. 90 tablet 3 12/02/2016 at Unknown time   No Known Allergies Past Medical History:  Diagnosis Date  . Anemia    h/o  . Basal cell carcinoma of face 2000  . Colon polyp   . Hypothyroidism    Past Surgical History:  Procedure Laterality Date  . BASAL CELL CARCINOMA EXCISION  2000   face  . BIOPSY THYROID Left 1993  . CATARACT EXTRACTION Right 2009  . COLONOSCOPY  2011  . dental implant  08-03-15  . DENTAL SURGERY    . INGUINAL HERNIA REPAIR Right 08/08/2015   Procedure: HERNIA REPAIR INGUINAL ADULT;  Surgeon: Robert Bellow, MD;  Location: [facility] ORS;  Service: General;  Laterality: Right;  . INGUINAL HERNIA REPAIR Left 02/28/2016   Procedure: HERNIA REPAIR INGUINAL ADULT;  Surgeon: Robert Bellow, MD;  Location: [facility] ORS;  Service: General;  Laterality: Left;  . THYROID LOBECTOMY Left 1993   benign  . TONSILLECTOMY  1956   Social History   Social History  . Marital status: Divorced    Spouse name: N/A  . Number of children: N/A  . Years of education: N/A   Occupational History  . Not on file.   Social History Main Topics  . Smoking status: Never Smoker  . Smokeless tobacco: Never Used  . Alcohol use No  . Drug use: No  . Sexual activity: Not on file   Other Topics Concern  . Not on file   Social History Narrative  . No narrative on file   Social History   Social History Narrative  . No narrative on file     ROS: Negative.     PE: HEENT: Negative. Lungs: Clear. Cardio: RR.  Assessment/Plan:  Proceed with planned endoscopy.    Robert Bellow 12/03/2016

## 2016-12-03 NOTE — Anesthesia Preprocedure Evaluation (Signed)
Anesthesia Evaluation  Patient identified by MRN, date of birth, ID band Patient awake    Reviewed: Allergy & Precautions, NPO status , Patient's Chart, lab work & pertinent test results  Airway Mallampati: II       Dental  (+) Teeth Intact, Implants   Pulmonary neg pulmonary ROS,    breath sounds clear to auscultation       Cardiovascular Exercise Tolerance: Good negative cardio ROS   Rhythm:Regular Rate:Normal     Neuro/Psych negative neurological ROS  negative psych ROS   GI/Hepatic negative GI ROS, Neg liver ROS,   Endo/Other  Hypothyroidism   Renal/GU negative Renal ROS     Musculoskeletal negative musculoskeletal ROS (+)   Abdominal Normal abdominal exam  (+)   Peds negative pediatric ROS (+)  Hematology  (+) anemia ,   Anesthesia Other Findings   Reproductive/Obstetrics                             Anesthesia Physical Anesthesia Plan  ASA: II  Anesthesia Plan: General   Post-op Pain Management:    Induction: Intravenous  Airway Management Planned: Natural Airway and Nasal Cannula  Additional Equipment:   Intra-op Plan:   Post-operative Plan:   Informed Consent: I have reviewed the patients History and Physical, chart, labs and discussed the procedure including the risks, benefits and alternatives for the proposed anesthesia with the patient or authorized representative who has indicated his/her understanding and acceptance.     Plan Discussed with: Surgeon  Anesthesia Plan Comments:         Anesthesia Quick Evaluation

## 2016-12-04 ENCOUNTER — Encounter: Payer: Self-pay | Admitting: General Surgery

## 2017-02-27 ENCOUNTER — Encounter: Payer: Self-pay | Admitting: Family Medicine

## 2017-02-27 ENCOUNTER — Ambulatory Visit (INDEPENDENT_AMBULATORY_CARE_PROVIDER_SITE_OTHER): Payer: Medicare Other | Admitting: Family Medicine

## 2017-02-27 VITALS — BP 128/80 | HR 90 | Temp 98.3°F | Resp 16 | Ht 73.0 in | Wt 186.0 lb

## 2017-02-27 DIAGNOSIS — R7309 Other abnormal glucose: Secondary | ICD-10-CM

## 2017-02-27 DIAGNOSIS — R351 Nocturia: Secondary | ICD-10-CM | POA: Diagnosis not present

## 2017-02-27 DIAGNOSIS — Z1159 Encounter for screening for other viral diseases: Secondary | ICD-10-CM

## 2017-02-27 DIAGNOSIS — E034 Atrophy of thyroid (acquired): Secondary | ICD-10-CM

## 2017-02-27 DIAGNOSIS — Z862 Personal history of diseases of the blood and blood-forming organs and certain disorders involving the immune mechanism: Secondary | ICD-10-CM

## 2017-02-27 DIAGNOSIS — Z0000 Encounter for general adult medical examination without abnormal findings: Secondary | ICD-10-CM

## 2017-02-27 DIAGNOSIS — Z125 Encounter for screening for malignant neoplasm of prostate: Secondary | ICD-10-CM | POA: Diagnosis not present

## 2017-02-27 LAB — POCT GLYCOSYLATED HEMOGLOBIN (HGB A1C): HEMOGLOBIN A1C: 5.5

## 2017-02-27 MED ORDER — SYNTHROID 112 MCG PO TABS: 112.0000 ug | ORAL_TABLET | Freq: Every day | ORAL | 3 refills | Status: DC

## 2017-02-27 NOTE — Assessment & Plan Note (Signed)
Check CBC 

## 2017-02-27 NOTE — Assessment & Plan Note (Signed)
Stable, today A1c 5.5, below Pre-DM Range. Prior history of mildly elevated glucose with abnormal A1c up to 5.8 within pre-DM range but on re-check with lifestyle mods dramatic improvement down to 5.4  Plan: 1. Reassurance, not consistent with Pre-DM only one time reading 2. Encouraged continue healthy lifestyle diet, exercise 3. Follow-up future lab A1c 09/2017, can continue to check q 6 months

## 2017-02-27 NOTE — Assessment & Plan Note (Signed)
Stable, controlled on Synthyroid Last TSH 1.6 >> 0.659 (10/10/16) Refilled Synthyroid (brand) 133mcg daily #90 +3 refills Future orders TSH, Free T4 ordered for 09/2017

## 2017-02-27 NOTE — Progress Notes (Signed)
Subjective:    Patient ID: Samuel [name], male    DOB: 1951-01-17, 66 y.o.   MRN: [id number]  Samuel [name] is a 66 y.o. male presenting on 02/27/2017 for Annual Exam   HPI  History of Abnormal Glucose / H/o Obesity s/p Weight Loss - Reports no current problems with glucose, HTN, HLD. He tries to maintain healthy and active lifestyle. Prior history of obesity in 1970s, 260 lbs then down to 170-180 lbs and has been stable mostly since. Current wt up several lbs. BMI normal  Cardiac history / PVCs / Normal Stress Tests / S/p Hernia Repair, b/l - Reports prior history of cardiac evaluation for some symptoms of chest pain back in 1980s and 2012, all work-ups have been negative, previously did treadmill exercise stress test - Most recently had pre-op evaluation including EKG with PVCs prior to hernia repair surgery, no further management, and he was asymptomatic - No concerns today  Hypothyroidism - Reports chronic history controlled on Synthyroid brand 148mcg daily, no changes recently,  Previously followed by Endocrinology, however no longer. 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 Anemia    h/o  . Basal cell carcinoma of face 2000  . Colon polyp   . Hypothyroidism    Past Surgical History:  Procedure Laterality Date  . BASAL CELL CARCINOMA EXCISION  2000   face  . BIOPSY THYROID Left 1993  . CATARACT EXTRACTION Right 2009  . COLONOSCOPY  2011  . COLONOSCOPY WITH PROPOFOL N/A 12/03/2016   Procedure: COLONOSCOPY WITH PROPOFOL;  Surgeon: Robert Bellow, MD;  Location: [facility] ENDOSCOPY;  Service: Endoscopy;  Laterality: N/A;  . dental implant  08-03-15  . DENTAL SURGERY    . HERNIA REPAIR    . INGUINAL HERNIA REPAIR Right 08/08/2015   Procedure: HERNIA REPAIR INGUINAL ADULT;  Surgeon: Robert Bellow, MD;  Location: [facility] ORS;  Service: General;  Laterality: Right;  . INGUINAL HERNIA REPAIR Left 02/28/2016   Procedure: HERNIA REPAIR INGUINAL ADULT;  Surgeon: Robert Bellow, MD;  Location: [facility] ORS;  Service: General;  Laterality: Left;  . THYROID LOBECTOMY Left 1993   benign  . TONSILLECTOMY  1956   Social History   Social History  . Marital status: Divorced    Spouse name: N/A  . Number of children: N/A  . Years of education: Physician   Occupational History  . Occupational psychologist, Mining engineer. Also performs accupuncture   Social History Main Topics  . Smoking status: Never Smoker  . Smokeless tobacco: Never Used  . Alcohol use No  . Drug  use: No  . Sexual activity: Not on file   Other Topics Concern  . Not on file   Social History Narrative   [place], [place]   Family History  Problem Relation Age of Onset  . Cancer Mother     uterine  . Congestive Heart Failure Mother   . Hypertension Mother   . CVA Father   . Hypertension Father    Current Outpatient Prescriptions on File Prior to Visit  Medication Sig  . MULTIPLE VITAMIN PO Take 1 tablet by mouth every morning.   No current facility-administered medications on file prior to visit.     Review of Systems    Constitutional: Negative for activity change, appetite change, chills, diaphoresis, fatigue and fever.  HENT: Negative for congestion, hearing loss and sinus pressure.   Eyes: Negative for visual disturbance.  Respiratory: Negative for cough, chest tightness, shortness of breath and wheezing.   Cardiovascular: Negative for chest pain, palpitations and leg swelling.  Gastrointestinal: Negative for abdominal pain, constipation, diarrhea, nausea and vomiting.  Endocrine: Negative for cold intolerance and polyuria.  Genitourinary: Negative for decreased urine volume, difficulty urinating, dysuria, frequency, hematuria, testicular pain and urgency.  Musculoskeletal: Negative for arthralgias, back pain and neck pain.  Skin: Negative for rash.  Allergic/Immunologic: Negative for environmental allergies.  Neurological: Negative for dizziness, weakness, light-headedness, numbness and headaches.  Hematological: Negative for adenopathy.  Psychiatric/Behavioral: Negative for behavioral problems, decreased concentration, dysphoric mood and sleep disturbance.   Per HPI unless specifically indicated above     Objective:    BP 128/80 (BP Location: Left Arm, Cuff Size: Normal)   Pulse 90   Temp 98.3 F (36.8 C) (Oral)   Resp 16   Ht 6\' 1"  (1.854 m)   Wt 186 lb (84.4 kg)   BMI 24.54 kg/m   Wt Readings from Last 3 Encounters:  02/27/17 186 lb (84.4 kg)  12/03/16 182 lb (82.6 kg)  10/28/16 181 lb (82.1 kg)    Physical Exam  Constitutional: He is oriented to person, place, and time. He appears well-developed and well-nourished. No distress.  Well-appearing, comfortable, cooperative  HENT:  Head: Normocephalic and atraumatic.  Mouth/Throat: Oropharynx is clear and moist.  Nares patent without purulence or edema. Bilateral TMs clear without erythema, effusion or bulging. Oropharynx clear without erythema, exudates, edema or asymmetry.  Eyes: Conjunctivae and EOM are normal. Pupils are equal,  round, and reactive to light.  Neck: Normal range of motion. Neck supple. No thyromegaly present.  No carotid bruits  Cardiovascular: Normal rate, regular rhythm, normal heart sounds and intact distal pulses.   No murmur heard. No ectopy  Pulmonary/Chest: Effort normal and breath sounds normal. No respiratory distress. He has no wheezes. He has no rales.  Abdominal: Soft. Bowel sounds are normal. He exhibits no distension and no mass. There is no tenderness.  Genitourinary:  Genitourinary Comments: Rectal/DRE: Normal external exam without hemorrhoids fissures or abnormality. DRE with palpation of normal sized prostate smooth symmetrical without nodule or tenderness.  Musculoskeletal: Normal range of motion. He exhibits no edema or tenderness.  Back normal without deformity or abnormal curvature.  Upper / Lower Extremities: - Normal muscle tone, strength bilateral upper extremities 5/5, lower extremities 5/5  - Normal Gait  Lymphadenopathy:    He has no cervical adenopathy.  Neurological: He is alert and oriented to person, place, and time.  Distal sensation to light touch  Skin: Skin is warm and dry. No rash noted. He is not  diaphoretic.  Psychiatric: He has a normal mood and affect. His behavior is normal.  Well groomed, good eye contact, normal speech and thoughts  Nursing note and vitals reviewed.   Results for orders placed or performed in visit on 02/27/17  POCT HgB A1C  Result Value Ref Range   Hemoglobin A1C 5.5       Assessment & Plan:   Problem List Items Addressed This Visit    Hypothyroidism due to acquired atrophy of thyroid    Stable, controlled on Synthyroid Last TSH 1.6 >> 0.659 (10/10/16) Refilled Synthyroid (brand) 165mcg daily #90 +3 refills Future orders TSH, Free T4 ordered for 09/2017      Relevant Medications   SYNTHROID 112 MCG tablet   Other Relevant Orders   TSH   T4, free   Comprehensive metabolic panel   CBC with Differential/Platelet    Lipid panel   History of anemia    Check CBC      Relevant Orders   CBC with Differential/Platelet   Abnormal glucose    Stable, today A1c 5.5, below Pre-DM Range. Prior history of mildly elevated glucose with abnormal A1c up to 5.8 within pre-DM range but on re-check with lifestyle mods dramatic improvement down to 5.4  Plan: 1. Reassurance, not consistent with Pre-DM only one time reading 2. Encouraged continue healthy lifestyle diet, exercise 3. Follow-up future lab A1c 09/2017, can continue to check q 6 months      Relevant Orders   POCT HgB A1C (Completed)   Hemoglobin A1c    Other Visit Diagnoses    Annual physical exam    -  Primary   Relevant Orders   Comprehensive metabolic panel   Need for hepatitis C screening test       Relevant Orders   Hepatitis C antibody   Encounter for screening for malignant neoplasm of prostate       Normal DRE. no personal or family history. Did have h/o elevated PSA in past due to possible infection. Since resolved. Last PSA 1.0 (12/2015). Future order PSA   Relevant Orders   PSA, total and free   Nocturia       No other BPH symptoms, based on age and prior screening request, will check PSA   Relevant Orders   PSA, total and free      Meds ordered this encounter  Medications  . SYNTHROID 112 MCG tablet    Sig: Take 1 tablet (112 mcg total) by mouth daily before breakfast.    Dispense:  90 tablet    Refill:  3    Follow up plan: Return in about 1 year (around 02/27/2018) for Annual Physical.  Fasting labs to be done in 09/2017 to be done after 1 calendar year, he orders own labs usually q 6 month, will try to sync labs for yearly, or can order q 6 months  Nobie Putnam, [facility] 02/27/2017, 10:33 PM

## 2017-02-27 NOTE — Patient Instructions (Signed)
Thank you for coming in to clinic today.  1.  Keep up the good work  A1c 5.5, previously 5.4, we will check again with labs in 09/2016  Lipid Panel and PSA (total + free) were not covered by Medicare screening   Ordered labs for [facility] in 09/2017, after 10/10/17 preferred, NOTIFY our office within a week or two up to 6 weeks before to release the orders and you can get them drawn at [facility]  Please schedule a follow-up appointment with Dr. Parks Ranger in 1 year for Annual Physical  If you have any other questions or concerns, please feel free to call the clinic or send a message through [place]. You may also schedule an earlier appointment if necessary.  Nobie Putnam, DO [place]

## 2018-02-26 ENCOUNTER — Other Ambulatory Visit: Payer: Medicare Other

## 2018-02-26 ENCOUNTER — Other Ambulatory Visit: Payer: Self-pay | Admitting: Family Medicine

## 2018-02-26 DIAGNOSIS — Z1159 Encounter for screening for other viral diseases: Secondary | ICD-10-CM

## 2018-02-26 DIAGNOSIS — R7309 Other abnormal glucose: Secondary | ICD-10-CM

## 2018-02-26 DIAGNOSIS — Z862 Personal history of diseases of the blood and blood-forming organs and certain disorders involving the immune mechanism: Secondary | ICD-10-CM

## 2018-02-26 DIAGNOSIS — R351 Nocturia: Secondary | ICD-10-CM

## 2018-02-26 DIAGNOSIS — Z0000 Encounter for general adult medical examination without abnormal findings: Secondary | ICD-10-CM

## 2018-02-26 DIAGNOSIS — Z125 Encounter for screening for malignant neoplasm of prostate: Secondary | ICD-10-CM

## 2018-02-26 DIAGNOSIS — E034 Atrophy of thyroid (acquired): Secondary | ICD-10-CM

## 2018-03-01 ENCOUNTER — Telehealth: Payer: Self-pay | Admitting: Family Medicine

## 2018-03-01 LAB — LIPID PANEL
CHOLESTEROL: 161 mg/dL (ref <200)
HDL: 64 mg/dL (ref >40)
LDL CHOLESTEROL (CALC): 83 mg/dL
Non-HDL Cholesterol (Calc): 97 mg/dL (calc) (ref <130)
Total CHOL/HDL Ratio: 2.5 (calc) (ref <5.0)
Triglycerides: 59 mg/dL (ref <150)

## 2018-03-01 LAB — CBC WITH DIFFERENTIAL/PLATELET
Basophils Absolute: 40 cells/uL (ref 0–200)
Basophils Relative: 1 %
EOS PCT: 4 %
Eosinophils Absolute: 160 cells/uL (ref 15–500)
HEMATOCRIT: 41.6 % (ref 38.5–50.0)
HEMOGLOBIN: 14.2 g/dL (ref 13.2–17.1)
LYMPHS ABS: 1148 {cells}/uL (ref 850–3900)
MCH: 29.6 pg (ref 27.0–33.0)
MCHC: 34.1 g/dL (ref 32.0–36.0)
MCV: 86.7 fL (ref 80.0–100.0)
MONOS PCT: 11.4 %
MPV: 8.9 fL (ref 7.5–12.5)
Neutro Abs: 2196 cells/uL (ref 1500–7800)
Neutrophils Relative %: 54.9 %
Platelets: 242 10*3/uL (ref 140–400)
RBC: 4.8 10*6/uL (ref 4.20–5.80)
RDW: 12.3 % (ref 11.0–15.0)
Total Lymphocyte: 28.7 %
WBC mixed population: 456 cells/uL (ref 200–950)
WBC: 4 10*3/uL (ref 3.8–10.8)

## 2018-03-01 LAB — COMPLETE METABOLIC PANEL WITH GFR
AG Ratio: 2 (calc) (ref 1.0–2.5)
ALBUMIN MSPROF: 4.5 g/dL (ref 3.6–5.1)
ALT: 24 U/L (ref 9–46)
AST: 33 U/L (ref 10–35)
Alkaline phosphatase (APISO): 49 U/L (ref 40–115)
BUN: 16 mg/dL (ref 7–25)
CALCIUM: 9.3 mg/dL (ref 8.6–10.3)
CO2: 28 mmol/L (ref 20–32)
CREATININE: 0.89 mg/dL (ref 0.70–1.25)
Chloride: 97 mmol/L — ABNORMAL LOW (ref 98–110)
GFR, EST AFRICAN AMERICAN: 103 mL/min/{1.73_m2} (ref >=60)
GFR, EST NON AFRICAN AMERICAN: 88 mL/min/{1.73_m2} (ref >=60)
Globulin: 2.2 g/dL (calc) (ref 1.9–3.7)
Glucose, Bld: 91 mg/dL (ref 65–99)
Potassium: 4.1 mmol/L (ref 3.5–5.3)
Sodium: 132 mmol/L — ABNORMAL LOW (ref 135–146)
TOTAL PROTEIN: 6.7 g/dL (ref 6.1–8.1)
Total Bilirubin: 0.9 mg/dL (ref 0.2–1.2)

## 2018-03-01 LAB — HEMOGLOBIN A1C
Hgb A1c MFr Bld: 5.3 % of total Hgb (ref <5.7)
Mean Plasma Glucose: 105 (calc)
eAG (mmol/L): 5.8 (calc)

## 2018-03-01 LAB — TSH: TSH: 1.4 m[IU]/L (ref 0.40–4.50)

## 2018-03-01 LAB — HEPATITIS C ANTIBODY
HEP C AB: NONREACTIVE
SIGNAL TO CUT-OFF: 0.06 (ref <1.00)

## 2018-03-01 LAB — PSA, TOTAL AND FREE
PSA, % FREE: 27 % (ref >25)
PSA, FREE: 0.3 ng/mL
PSA, Total: 1.1 ng/mL (ref <=4.0)

## 2018-03-01 LAB — T4, FREE: FREE T4: 1.7 ng/dL (ref 0.8–1.8)

## 2018-03-01 NOTE — Telephone Encounter (Signed)
Called to schedule AWV with Nurse Health Advisor. Eligible as of 01/22/17 per [facility] [phone]  Skype [email or web address]

## 2018-03-05 ENCOUNTER — Encounter: Payer: Self-pay | Admitting: Family Medicine

## 2018-03-05 ENCOUNTER — Ambulatory Visit (INDEPENDENT_AMBULATORY_CARE_PROVIDER_SITE_OTHER): Payer: Medicare Other | Admitting: Family Medicine

## 2018-03-05 VITALS — BP 138/78 | HR 76 | Temp 98.1°F | Resp 16 | Ht 73.0 in | Wt 185.0 lb

## 2018-03-05 DIAGNOSIS — Z0000 Encounter for general adult medical examination without abnormal findings: Secondary | ICD-10-CM

## 2018-03-05 DIAGNOSIS — E871 Hypo-osmolality and hyponatremia: Secondary | ICD-10-CM | POA: Diagnosis not present

## 2018-03-05 DIAGNOSIS — W57XXXA Bitten or stung by nonvenomous insect and other nonvenomous arthropods, initial encounter: Secondary | ICD-10-CM

## 2018-03-05 DIAGNOSIS — R030 Elevated blood-pressure reading, without diagnosis of hypertension: Secondary | ICD-10-CM | POA: Diagnosis not present

## 2018-03-05 DIAGNOSIS — E034 Atrophy of thyroid (acquired): Secondary | ICD-10-CM | POA: Diagnosis not present

## 2018-03-05 DIAGNOSIS — S30861A Insect bite (nonvenomous) of abdominal wall, initial encounter: Secondary | ICD-10-CM

## 2018-03-05 MED ORDER — DOXYCYCLINE HYCLATE 100 MG PO TABS: 200.0000 mg | ORAL_TABLET | Freq: Once | ORAL | 0 refills | Status: AC

## 2018-03-05 MED ORDER — SYNTHROID 112 MCG PO TABS: 112.0000 ug | ORAL_TABLET | Freq: Every day | ORAL | 3 refills | Status: DC

## 2018-03-05 NOTE — Progress Notes (Signed)
Subjective:    Patient ID: Samuel [name], male    DOB: Oct 27, 1951, 67 y.o.   MRN: [id number]  Samuel [name] is a 67 y.o. male presenting on 03/05/2018 for Hypothyroidism   HPI   Lifestyle / Mild Low Sodium / History of similar chronic hyponatremia - He reports doing well overall. Has not changed much of his routine or lifestyle - Recent labs showed mild low sodium at 132, he has had similar result before, does not have lab value, some scanned into system from 02/2016 at Sodium 134 and Chloride 92, both mildly low. - He admits to never having significant problem from this. He is asymptomatic. 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 Sometimes has longer urination but not weaker stream. Usually feeling empty after voiding. - Prior trial on saw palmetto with some relief, no longer taking - Remote history of prostatitis  Tick Attached, asymptomatic Reported new problem, found an embedded tick in groin, on Wednesday 2 days ago, noticed this when showering, and he removed it with tweezers thinks he got all of it, was none left, but did not come out cleanly. Has not developed rash or any symptoms. Has had many ticks before, working out on farm / woods. Never had significant illness or problems from it.   Health Maintenance: Due for Flu vaccine, declined  despite counseling  Due for initial pneumonia vaccine at age >42 - declined Prevnar-75 today, despite counseling, he states will reconsider if immune system appears to be weaker  Depression screen [facility] 2/9 03/05/2018 02/27/2017 02/22/2016  Decreased Interest 0 0 0  Down, Depressed, Hopeless 0 0 0  PHQ - 2 Score 0 0 0    Past Medical History:  Diagnosis Date  . Anemia    h/o  . Basal cell carcinoma of face 2000  . Colon polyp   . Hypothyroidism    Past Surgical History:  Procedure Laterality Date  . BASAL CELL CARCINOMA EXCISION  2000   face  . BIOPSY THYROID Left 1993  . CATARACT EXTRACTION Right 2009  . COLONOSCOPY  2011  . COLONOSCOPY WITH PROPOFOL N/A 12/03/2016   Procedure: COLONOSCOPY WITH PROPOFOL;  Surgeon: Robert Bellow, MD;  Location: [facility] ENDOSCOPY;  Service: Endoscopy;  Laterality: N/A;  . dental implant  08-03-15  . DENTAL SURGERY    . HERNIA REPAIR    . INGUINAL HERNIA REPAIR Right 08/08/2015   Procedure: HERNIA REPAIR INGUINAL ADULT;  Surgeon: Robert Bellow, MD;  Location: [facility] ORS;  Service: General;  Laterality: Right;  . INGUINAL HERNIA REPAIR Left 02/28/2016   Procedure: HERNIA REPAIR INGUINAL ADULT;  Surgeon: Robert Bellow, MD;  Location: [facility] ORS;  Service: General;  Laterality: Left;  . THYROID LOBECTOMY Left 1993   benign  . [place]  History   Socioeconomic History  . Marital status: Divorced    Spouse name: Not on file  . Number of children: Not on file  . Years of education: Physician  . Highest education level: Not on file  Social Needs  . Financial resource strain: Not on file  . Food insecurity - worry: Not on file  . Food insecurity - inability: Not on file  . Transportation needs - medical: Not on file  . Transportation needs - non-medical: Not on file  Occupational History  . Occupation: Restaurant manager, fast food    CommentTheatre stage manager, Mining engineer. Also performs accupuncture  Tobacco Use  . Smoking status: Never Smoker  .  Smokeless tobacco: Never Used  Substance and Sexual Activity  . Alcohol use: No    Alcohol/week: 0.0 oz  . Drug use: No  . Sexual activity: Not on file  Other Topics Concern  . Not on file  Social History Narrative   [place], [place]   Family History  Problem Relation Age of Onset  . Cancer Mother        uterine  . Congestive Heart Failure Mother   . Hypertension Mother   . CVA Father   . Hypertension Father    Current Outpatient Medications on File Prior to Visit  Medication Sig  . MULTIPLE VITAMIN PO Take 1 tablet by mouth every morning.   No current facility-administered medications on file prior to visit.     Review of Systems  Constitutional: Negative for activity change, appetite change, chills, diaphoresis, fatigue and fever.  HENT: Negative for congestion and hearing loss.   Eyes: Negative for visual disturbance.  Respiratory: Negative for apnea, cough, chest tightness, shortness of breath and wheezing.   Cardiovascular: Negative for chest pain, palpitations and leg swelling.  Gastrointestinal: Negative for abdominal pain, anal bleeding, blood in stool, constipation, diarrhea, nausea and vomiting.  Endocrine: Negative for cold intolerance and polyuria.  Genitourinary: Negative for decreased urine volume, difficulty urinating, dysuria, frequency, hematuria, testicular pain and urgency.  Musculoskeletal: Negative for arthralgias, back pain and neck pain.  Skin: Negative for rash.  Allergic/Immunologic: Negative for environmental allergies.  Neurological: Negative for dizziness, weakness, light-headedness, numbness and headaches.  Hematological: Negative for adenopathy.  Psychiatric/Behavioral: Negative for behavioral problems, dysphoric mood and sleep disturbance. The patient is not nervous/anxious.    Per HPI unless specifically indicated above     Objective:    BP 138/78 (BP Location: Left Arm, Cuff Size: Normal)   Pulse 76   Temp 98.1 F (36.7  C) (Oral)   Resp 16   Ht 6\' 1"  (1.854 m)   Wt 185 lb (83.9 kg)   BMI 24.41 kg/m   Wt Readings from Last 3 Encounters:  03/05/18 185 lb (83.9 kg)  02/27/17 186 lb (84.4 kg)  12/03/16 182 lb (82.6 kg)    Physical Exam  Constitutional: He is oriented to person, place, and time. He appears well-developed and well-nourished. No distress.  Well-appearing, comfortable, cooperative  HENT:  Head: Normocephalic and atraumatic.  Mouth/Throat: Oropharynx is clear and moist.  Frontal / maxillary sinuses non-tender. Nares patent without purulence or edema. Bilateral TMs clear without erythema, effusion or bulging. Oropharynx clear without erythema, exudates, edema or asymmetry.  Eyes: Conjunctivae and EOM are normal. Pupils are equal, round, and reactive to light. Right eye exhibits no discharge. Left eye exhibits no discharge.  Neck: Normal range of motion. Neck supple. No thyromegaly present.  No carotid bruits heard  Cardiovascular: Normal rate, regular  rhythm, normal heart sounds and intact distal pulses.  No murmur heard. Pulmonary/Chest: Effort normal and breath sounds normal. No respiratory distress. He has no wheezes. He has no rales.  Abdominal: Soft. Bowel sounds are normal. He exhibits no distension and no mass. There is no tenderness.  Musculoskeletal: Normal range of motion. He exhibits no edema or tenderness.  Upper / Lower Extremities: - Normal muscle tone, strength bilateral upper extremities 5/5, lower extremities 5/5  Lymphadenopathy:    He has no cervical adenopathy.  Neurological: He is alert and oriented to person, place, and time.  Distal sensation intact to light touch all extremities  Skin: Skin is warm and dry. Rash (mild localized left medial ankle patchy area of non raised slightly red irritated skin, not dry or rough) noted. He is not diaphoretic. No erythema.  Psychiatric: He has a normal mood and affect. His behavior is normal.  Well groomed, good eye contact,  normal speech and thoughts  Nursing note and vitals reviewed.  Results for orders placed or performed in visit on 02/26/18  Lipid panel  Result Value Ref Range   Cholesterol 161 <200 mg/dL   HDL 64 >40 mg/dL   Triglycerides 59 <150 mg/dL   LDL Cholesterol (Calc) 83 mg/dL (calc)   Total CHOL/HDL Ratio 2.5 <5.0 (calc)   Non-HDL Cholesterol (Calc) 97 <130 mg/dL (calc)  COMPLETE METABOLIC PANEL WITH GFR  Result Value Ref Range   Glucose, Bld 91 65 - 99 mg/dL   BUN 16 7 - 25 mg/dL   Creat 0.89 0.70 - 1.25 mg/dL   GFR, Est Non African American 88 > OR = 60 mL/min/1.43m2   GFR, Est African American 103 > OR = 60 mL/min/1.55m2   BUN/Creatinine Ratio NOT APPLICABLE 6 - 22 (calc)   Sodium 132 (L) 135 - 146 mmol/L   Potassium 4.1 3.5 - 5.3 mmol/L   Chloride 97 (L) 98 - 110 mmol/L   CO2 28 20 - 32 mmol/L   Calcium 9.3 8.6 - 10.3 mg/dL   Total Protein 6.7 6.1 - 8.1 g/dL   Albumin 4.5 3.6 - 5.1 g/dL   Globulin 2.2 1.9 - 3.7 g/dL (calc)   AG Ratio 2.0 1.0 - 2.5 (calc)   Total Bilirubin 0.9 0.2 - 1.2 mg/dL   Alkaline phosphatase (APISO) 49 40 - 115 U/L   AST 33 10 - 35 U/L   ALT 24 9 - 46 U/L  Hemoglobin A1c  Result Value Ref Range   Hgb A1c MFr Bld 5.3 <5.7 % of total Hgb   Mean Plasma Glucose 105 (calc)   eAG (mmol/L) 5.8 (calc)  TSH  Result Value Ref Range   TSH 1.40 0.40 - 4.50 mIU/L  T4, free  Result Value Ref Range   Free T4 1.7 0.8 - 1.8 ng/dL  CBC with Differential/Platelet  Result Value Ref Range   WBC 4.0 3.8 - 10.8 Thousand/uL   RBC 4.80 4.20 - 5.80 Million/uL   Hemoglobin 14.2 13.2 - 17.1 g/dL   HCT 41.6 38.5 - 50.0 %   MCV 86.7 80.0 - 100.0 fL   MCH 29.6 27.0 - 33.0 pg   MCHC 34.1 32.0 - 36.0 g/dL   RDW 12.3 11.0 - 15.0 %   Platelets 242 140 - 400 Thousand/uL   MPV 8.9 7.5 - 12.5 fL   Neutro Abs 2,196 1,500 - 7,800 cells/uL   Lymphs Abs 1,148 850 - 3,900 cells/uL   WBC mixed population 456 200 - 950 cells/uL  Eosinophils Absolute 160 15 - 500 cells/uL    Basophils Absolute 40 0 - 200 cells/uL   Neutrophils Relative % 54.9 %   Total Lymphocyte 28.7 %   Monocytes Relative 11.4 %   Eosinophils Relative 4.0 %   Basophils Relative 1.0 %  Hepatitis C antibody  Result Value Ref Range   Hepatitis C Ab NON-REACTIVE NON-REACTI   SIGNAL TO CUT-OFF 0.06 <1.00  PSA, total and free  Result Value Ref Range   PSA, Total 1.1 < OR = 4.0 ng/mL   PSA, Free 0.3 ng/mL   PSA, % Free 27 >25 % (calc)      Assessment & Plan:   Problem List Items Addressed This Visit    Hyponatremia    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 Similar results on last chem 2017 Seems to be chronic problem Asymptomatic Possibly with inc free water intake based on history Follow-up future lab trend, if worsening or new concerns, consider further hyponatremia work-up      Hypothyroidism due to acquired atrophy of thyroid    Stable, controlled on Synthyroid Last TSH 1.40 Refilled Synthyroid (brand) 158mcg daily #90 +3 refills Check TSH/Free T4 yearly since stable      Relevant Medications   SYNTHROID 112 MCG tablet    Other Visit Diagnoses    Annual physical exam    -  Primary Updated Health Maintenance, declined vaccines Reviewed labs Encouraged continue improved diet and exercise, maintain healthy wt     Tick bite of groin, initial encounter       Asymptomatic, tick on for < 24 hours, removal but not intact, rx precautionary doxy 200mg  x 1 dose prophylaxis      Elevated BP without diagnosis of hypertension   Mildly elevated initial BP, repeat manual check improved. - Home BP readings normal 110-120s avg, occasional 659  No known complications    Plan:  1. Encourage improved lifestyle - low sodium diet, regular exercise 2. Continue monitor BP outside office, bring readings to next visit, if persistently >140/90 or new symptoms notify office sooner 3. Follow-up q 6 mo      ASCVD Risk 9-10% Based on risk score calculator, primarily d/t age Discussed ASCVD  risk, and offered statin / ASA as options - declined reconsider in future  Meds ordered this encounter  Medications  . doxycycline (VIBRA-TABS) 100 MG tablet    Sig: Take 2 tablets (200 mg total) by mouth once for 1 dose. For tick/lyme prophylaxis    Dispense:  2 tablet    Refill:  0  . SYNTHROID 112 MCG tablet    Sig: Take 1 tablet (112 mcg total) by mouth daily before breakfast.    Dispense:  90 tablet    Refill:  3    Follow up plan: Return in about 6 months (around 09/05/2018) for BP check, Lab Review Sodium/Lipids.  Nobie Putnam, [facility] 03/06/2018, 12:08 AM

## 2018-03-05 NOTE — Patient Instructions (Addendum)
Thank you for coming to the office today.  1.  Keep up the great work overall  Continue monitoring BP at home  Continue with regular exercise  For tick exposure - sent Doxycycline 100mg  x 2 pill at once for 200mg  prophylaxis dose  Due for labs in 6 months - have your own labs drawn for BMET (Basic Metabolic) to get Sodium / Chloride level and also an Advanced Lipid Panel (Lipoprotein) (w/ Inflammation) - Cardiac IQ  ASCVD Risk Calculator (AHA / ACC) - your calculation ranging from 9 to 11% of cardio avascular risk in 10 years  Please schedule a Follow-up Appointment to: Return in about 6 months (around 09/05/2018) for BP check, Lab Review Sodium/Lipids.  If you have any other questions or concerns, please feel free to call the office or send a message through [place]. You may also schedule an earlier appointment if necessary.  Additionally, you may be receiving a survey about your experience at our office within a few days to 1 week by e-mail or mail. We value your feedback.  Nobie Putnam, DO [place]

## 2018-03-06 DIAGNOSIS — R030 Elevated blood-pressure reading, without diagnosis of hypertension: Secondary | ICD-10-CM | POA: Insufficient documentation

## 2018-03-06 DIAGNOSIS — E871 Hypo-osmolality and hyponatremia: Secondary | ICD-10-CM | POA: Insufficient documentation

## 2018-03-06 NOTE — Assessment & Plan Note (Signed)
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

## 2018-03-06 NOTE — Assessment & Plan Note (Signed)
Stable, controlled on Synthyroid Last TSH 1.40 Refilled Synthyroid (brand) 146mcg daily #90 +3 refills Check TSH/Free T4 yearly since stable

## 2018-03-06 NOTE — Assessment & Plan Note (Signed)
Mildly elevated initial BP, repeat manual check improved. - Home BP readings normal 110-120s avg, occasional 161  No known complications    Plan:  1. Encourage improved lifestyle - low sodium diet, regular exercise 2. Continue monitor BP outside office, bring readings to next visit, if persistently >140/90 or new symptoms notify office sooner 3. Follow-up q 6 mo

## 2018-08-20 LAB — BASIC METABOLIC PANEL
BUN: 15 (ref 4–21)
Creatinine: 0.9 (ref 0.6–1.3)
Glucose: 97
Potassium: 4.5 (ref 3.4–5.3)
SODIUM: 136 — AB (ref 137–147)

## 2018-08-20 LAB — CARDIO IQ(R) ADVANCED LIPID PANEL
HDL Particle Number: >34.9
LDL Particle Number: 738 (ref ?–1000)
LDL SIZE: 23
LP-IR Score: 26 (ref ?–45)
Small LDL Particle Number: <117

## 2018-08-20 LAB — HEPATIC FUNCTION PANEL
ALT: 21 (ref 10–40)
AST: 32 (ref 14–40)
Alkaline Phosphatase: 53 (ref 25–125)
Bilirubin, Total: 0.8

## 2018-08-20 LAB — IRON,TIBC AND FERRITIN PANEL
%SAT: 41
IRON: 129
TIBC: 312
UIBC: 183

## 2018-08-20 LAB — LIPID PANEL
Cholesterol: 149 (ref 0–200)
HDL: 65 (ref 35–70)
LDL CALC: 72
Triglycerides: 62 (ref 40–160)

## 2018-09-01 ENCOUNTER — Encounter: Payer: Self-pay | Admitting: Family Medicine

## 2018-09-10 ENCOUNTER — Ambulatory Visit: Payer: Medicare Other | Admitting: Family Medicine

## 2019-11-23 ENCOUNTER — Other Ambulatory Visit: Payer: Self-pay | Admitting: Family Medicine

## 2019-11-23 DIAGNOSIS — E034 Atrophy of thyroid (acquired): Secondary | ICD-10-CM

## 2020-04-05 ENCOUNTER — Encounter
Admission: RE | Admit: 2020-04-05 | Discharge: 2020-04-05 | Disposition: A | Discharge status: Home / Self Care | Payer: Medicare Other | Source: Ambulatory Visit | Attending: Urology | Admitting: Urology

## 2020-04-05 NOTE — Patient Instructions (Signed)
Your pre-op EKG visit is scheduled on: Friday 04/06/2020 @ 10:30 am.  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 Your pre-op COVID test is scheduled on: Tuesday 04/10/2020.  Drive up in front of the [facility] any time from 8am-1pm.  Your procedure is scheduled on: Thursday 04/12/2020 Report to Same Day Surgery [facility] Entrance, then take elevator to 2nd floor.  Check in with Surgery Information desk.) To find out your arrival time, call [phone] 1:00-3:00 PM on Wednesday 04/11/2020  Remember: Instructions that are not followed completely may result in serious medical risk, up to and including death, or upon the discretion of your surgeon and anesthesiologist your surgery may need to be rescheduled.   __x__ 1. Do not eat food (including mints, candies, chewing gum) after midnight the night before your procedure. You may drink clear liquids up to 2 hours before you are scheduled to arrive at the hospital for your procedure.  Do not drink anything within 2 hours of your scheduled arrival to the hospital.  Approved clear liquids:  --Water or Apple juice without pulp  --Clear carbohydrate beverage such as Gatorade or Powerade  --Black Coffee or Clear Tea (No milk, no creamers, do not add anything to the coffee or tea)   __x__ 2. No alcohol or smoking for 24 hours before or after surgery.  __x__ 3. Notify your doctor if there is any change in your medical condition (cold, fever, infections).  __x__ 4. On the morning of surgery brush your teeth with toothpaste and water.  You may rinse your mouth with mouthwash if you wish.  Do not swallow any toothpaste or mouthwash.  __x__ If available, use antibacterial soap or body wash such as Dial to shower/bathe on the day of surgery.  Do not wear jewelry, lotions, powders, perfumes, or deodorant on the day of surgery. Do not shave below your face/neck for 48 hours prior to surgery. Do not bring valuables to the hospital.  [facility] is not  responsible for any belongings or valuables.   Eyeglasses, contacts, dentures or bridgework may not be worn into surgery. For patients discharged on the day of surgery, you will NOT be permitted to drive yourself home.  You must have a responsible adult with you for 24 hours after surgery.  __x__ Take these medicines the morning of surgery with a SMALL SIP OF WATER:  1. Synthroid  _n/a_ Follow recommendations from Cardiologist, Pulmonologist or PCP regarding stopping any blood thinners.  __x__ STARTING TODAY UNTIL AFTER SURGERY: Do not take any anti-inflammatories such as Advil, Ibuprofen, Motrin, Aleve, Naproxen, Naprosyn, BC/Goodies powders or aspirin products. You may take Tylenol if needed.   __x__ STARTING TODAY UNTIL AFTER SURGERY: Do not take any over the counter herbal/ nutritional/ vitamins/ supplements until after surgery. You may continue to take your multivitamin.

## 2020-04-06 ENCOUNTER — Other Ambulatory Visit
Admission: RE | Admit: 2020-04-06 | Discharge: 2020-04-06 | Disposition: A | Discharge status: Home / Self Care | Payer: Medicare Other | Source: Ambulatory Visit | Attending: Urology | Admitting: Urology

## 2020-04-06 ENCOUNTER — Other Ambulatory Visit: Payer: Self-pay

## 2020-04-06 DIAGNOSIS — Z01818 Encounter for other preprocedural examination: Secondary | ICD-10-CM | POA: Insufficient documentation

## 2020-04-06 DIAGNOSIS — Z01810 Encounter for preprocedural cardiovascular examination: Secondary | ICD-10-CM

## 2020-04-06 LAB — CBC
HCT: 39.8 % (ref 39.0–52.0)
Hemoglobin: 13.9 g/dL (ref 13.0–17.0)
MCH: 30.8 pg (ref 26.0–34.0)
MCHC: 34.9 g/dL (ref 30.0–36.0)
MCV: 88.1 fL (ref 80.0–100.0)
Platelets: 217 10*3/uL (ref 150–400)
RBC: 4.52 MIL/uL (ref 4.22–5.81)
RDW: 12.6 % (ref 11.5–15.5)
WBC: 5.5 10*3/uL (ref 4.0–10.5)
nRBC: 0 % (ref 0.0–0.2)

## 2020-04-10 ENCOUNTER — Other Ambulatory Visit
Admission: RE | Admit: 2020-04-10 | Discharge: 2020-04-10 | Disposition: A | Discharge status: Home / Self Care | Payer: Medicare Other | Source: Ambulatory Visit | Attending: Urology | Admitting: Urology

## 2020-04-10 ENCOUNTER — Other Ambulatory Visit: Payer: Self-pay

## 2020-04-10 DIAGNOSIS — Z01812 Encounter for preprocedural laboratory examination: Secondary | ICD-10-CM | POA: Diagnosis present

## 2020-04-10 DIAGNOSIS — Z20822 Contact with and (suspected) exposure to covid-19: Secondary | ICD-10-CM | POA: Insufficient documentation

## 2020-04-10 LAB — SARS CORONAVIRUS 2 (TAT 6-24 HRS): SARS Coronavirus 2: NEGATIVE

## 2020-04-10 NOTE — H&P (Signed)
NAME: Samuel, [name] MEDICAL RECORD [id number] ACCOUNT [id number] DATE OF BIRTH:Nov 23, 1951 FACILITY: [facility] LOCATION: [facility] PHYSICIAN:Leif Loflin Farrel Conners, MD  HISTORY AND PHYSICAL  DATE OF ADMISSION:  04/12/2020  CHIEF COMPLAINT:  Difficulty voiding.  HISTORY OF PRESENT ILLNESS:  The patient is a 69 year old Caucasian male with significant BPH and lower urinary tract symptoms that has failed medical therapy.  Evaluation in the office indicated a 47.5 gram prostate with large intravesical median lobe.   PSA was 3.2 ng/mL.  Uroflow study dated 02/17/2020 indicated maximum flow rate of 4 mL per second with average flow rate of 1.9 mL per second and a postvoid residual of 221 mL based upon a voided volume of 144 mL and a prevoid volume of 363 mL.  He has  been treated with alfuzosin for over 6 months without improvement of his voiding symptoms.  He comes in now for photovaporization of prostate with the GreenLight laser.  ALLERGIES:  No drug allergies.  CURRENT MEDICATIONS:  Synthroid and alfuzosin.  PAST SURGICAL HISTORY:   1.  Removal of several skin cancers. 2.  Removal thyroid adenoma 1993.  3.  Tonsillectomy in 1957.  4.  Right cataract surgery 2007.  5.  Bilateral inguinal herniorrhaphy 2017.  PAST AND CURRENT MEDICAL CONDITIONS:  1.  Hypothyroidism. 2.  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  REVIEW OF SYSTEMS:  The patient denies chest pain, shortness of breath, diabetes, stroke or hypertension.  SOCIAL HISTORY:  The patient denied tobacco or alcohol use.  FAMILY HISTORY:  Father died at age 2 of a stroke and had diabetes.  Mother died at age 96 of heart failure and also diabetes.  PHYSICAL EXAMINATION: VITAL SIGNS:  Height 6 feet 0 inches, weight 179 pounds, BMI 24. GENERAL:  Well-nourished, white male in no acute distress. HEENT:  Sclerae were clear.  Pupils are equally round, reactive to light and accommodation.  Extraocular movements are intact. NECK:   No palpable masses or tenderness.  Thyroid gland was smooth without palpable nodules.  No audible carotid bruits. LYMPHATIC:  No palpable cervical or inguinal adenopathy. PULMONARY:  Lungs clear to auscultation. CARDIOVASCULAR:  Regular rhythm and rate without audible murmurs. ABDOMEN:  Soft, nontender abdomen.  No CVA tenderness. GENITOURINARY:  Circumcised.  Grade III left varicocele.  Testes were smooth, nontender 18 mL in size each. RECTAL:  Greater than 40 g, smooth, nontender prostate. NEUROMUSCULAR:  Alert and oriented x3.  IMPRESSION:  Benign prostatic hypertrophy with bladder outlet obstruction.  PLAN:  Photovaporization of the prostate with a GreenLight laser.  PN/NUANCE  D:04/09/2020 T:04/09/2020 JOB:[id number]

## 2020-04-12 ENCOUNTER — Other Ambulatory Visit: Payer: Self-pay

## 2020-04-12 ENCOUNTER — Encounter: Payer: Self-pay | Admitting: Urology

## 2020-04-12 ENCOUNTER — Ambulatory Visit: Payer: Medicare Other | Admitting: Registered Nurse

## 2020-04-12 ENCOUNTER — Ambulatory Visit
Admission: RE | Admit: 2020-04-12 | Discharge: 2020-04-12 | Disposition: A | Discharge status: Home / Self Care | Payer: Medicare Other | Attending: Urology | Admitting: Urology

## 2020-04-12 ENCOUNTER — Encounter
Admission: RE | Disposition: A | Discharge status: Home / Self Care | Payer: Self-pay | Source: Home / Self Care | Attending: Urology

## 2020-04-12 DIAGNOSIS — Z8249 Family history of ischemic heart disease and other diseases of the circulatory system: Secondary | ICD-10-CM | POA: Diagnosis not present

## 2020-04-12 DIAGNOSIS — N320 Bladder-neck obstruction: Secondary | ICD-10-CM | POA: Insufficient documentation

## 2020-04-12 DIAGNOSIS — Z823 Family history of stroke: Secondary | ICD-10-CM | POA: Insufficient documentation

## 2020-04-12 DIAGNOSIS — Z833 Family history of diabetes mellitus: Secondary | ICD-10-CM | POA: Insufficient documentation

## 2020-04-12 DIAGNOSIS — Z85828 Personal history of other malignant neoplasm of skin: Secondary | ICD-10-CM | POA: Diagnosis not present

## 2020-04-12 DIAGNOSIS — N138 Other obstructive and reflux uropathy: Secondary | ICD-10-CM | POA: Insufficient documentation

## 2020-04-12 DIAGNOSIS — N401 Enlarged prostate with lower urinary tract symptoms: Secondary | ICD-10-CM | POA: Insufficient documentation

## 2020-04-12 DIAGNOSIS — D649 Anemia, unspecified: Secondary | ICD-10-CM | POA: Diagnosis not present

## 2020-04-12 DIAGNOSIS — E039 Hypothyroidism, unspecified: Secondary | ICD-10-CM | POA: Diagnosis not present

## 2020-04-12 DIAGNOSIS — Z9841 Cataract extraction status, right eye: Secondary | ICD-10-CM | POA: Diagnosis not present

## 2020-04-12 HISTORY — DX: Other complications of anesthesia, initial encounter: T88.59XA

## 2020-04-12 HISTORY — PX: GREEN LIGHT LASER TURP (TRANSURETHRAL RESECTION OF PROSTATE: SHX6260

## 2020-04-12 SURGERY — GREEN LIGHT LASER TURP (TRANSURETHRAL RESECTION OF PROSTATE
Anesthesia: General

## 2020-04-12 MED ORDER — LIDOCAINE HCL URETHRAL/MUCOSAL 2 % EX GEL
CUTANEOUS | Status: DC | PRN
  Administered 2020-04-12: 1

## 2020-04-12 MED ORDER — CEFAZOLIN SODIUM-DEXTROSE 1-4 GM/50ML-% IV SOLN
1.0000 g | Freq: Once | INTRAVENOUS | Status: AC
  Administered 2020-04-12: 1 g via INTRAVENOUS

## 2020-04-12 MED ORDER — DEXAMETHASONE SODIUM PHOSPHATE 10 MG/ML IJ SOLN
INTRAMUSCULAR | Status: AC
  Filled 2020-04-12: qty 1

## 2020-04-12 MED ORDER — MIDAZOLAM HCL 2 MG/2ML IJ SOLN
INTRAMUSCULAR | Status: DC | PRN
  Administered 2020-04-12: 2 mg via INTRAVENOUS

## 2020-04-12 MED ORDER — FAMOTIDINE 20 MG PO TABS: 20.0000 mg | ORAL_TABLET | Freq: Once | ORAL | Status: AC

## 2020-04-12 MED ORDER — FLUMAZENIL 0.5 MG/5ML IV SOLN
INTRAVENOUS | Status: DC | PRN
  Administered 2020-04-12 (×2): .2 mg via INTRAVENOUS

## 2020-04-12 MED ORDER — LIDOCAINE HCL (CARDIAC) PF 100 MG/5ML IV SOSY
PREFILLED_SYRINGE | INTRAVENOUS | Status: DC | PRN
  Administered 2020-04-12: 80 mg via INTRAVENOUS

## 2020-04-12 MED ORDER — ONDANSETRON HCL 4 MG/2ML IJ SOLN
INTRAMUSCULAR | Status: AC
  Filled 2020-04-12: qty 2

## 2020-04-12 MED ORDER — ONDANSETRON HCL 4 MG/2ML IJ SOLN: 4.0000 mg | Freq: Once | INTRAMUSCULAR | Status: DC | PRN

## 2020-04-12 MED ORDER — LIDOCAINE HCL (PF) 2 % IJ SOLN
INTRAMUSCULAR | Status: AC
  Filled 2020-04-12: qty 5

## 2020-04-12 MED ORDER — ACETAMINOPHEN 10 MG/ML IV SOLN
INTRAVENOUS | Status: AC
  Filled 2020-04-12: qty 100

## 2020-04-12 MED ORDER — GLYCOPYRROLATE 0.2 MG/ML IJ SOLN
INTRAMUSCULAR | Status: DC | PRN
  Administered 2020-04-12: .2 mg via INTRAVENOUS

## 2020-04-12 MED ORDER — FENTANYL CITRATE (PF) 100 MCG/2ML IJ SOLN: 25.0000 ug | INTRAMUSCULAR | Status: DC | PRN

## 2020-04-12 MED ORDER — EPHEDRINE 5 MG/ML INJ
INTRAVENOUS | Status: AC
  Filled 2020-04-12: qty 10

## 2020-04-12 MED ORDER — LACTATED RINGERS IV SOLN: INTRAVENOUS | Status: DC

## 2020-04-12 MED ORDER — BELLADONNA ALKALOIDS-OPIUM 16.2-60 MG RE SUPP
RECTAL | Status: AC
  Filled 2020-04-12: qty 1

## 2020-04-12 MED ORDER — FLUMAZENIL 0.5 MG/5ML IV SOLN
INTRAVENOUS | Status: AC
  Filled 2020-04-12: qty 5

## 2020-04-12 MED ORDER — CEFAZOLIN SODIUM-DEXTROSE 1-4 GM/50ML-% IV SOLN
INTRAVENOUS | Status: AC
  Filled 2020-04-12: qty 50

## 2020-04-12 MED ORDER — BELLADONNA ALKALOIDS-OPIUM 16.2-60 MG RE SUPP
RECTAL | Status: DC | PRN
  Administered 2020-04-12: 1 via RECTAL

## 2020-04-12 MED ORDER — ONDANSETRON HCL 4 MG/2ML IJ SOLN
INTRAMUSCULAR | Status: DC | PRN
  Administered 2020-04-12: 4 mg via INTRAVENOUS

## 2020-04-12 MED ORDER — LEVOFLOXACIN IN D5W 500 MG/100ML IV SOLN
INTRAVENOUS | Status: AC
  Filled 2020-04-12: qty 100

## 2020-04-12 MED ORDER — SEVOFLURANE IN SOLN
RESPIRATORY_TRACT | Status: AC
  Filled 2020-04-12: qty 250

## 2020-04-12 MED ORDER — LIDOCAINE HCL URETHRAL/MUCOSAL 2 % EX GEL
CUTANEOUS | Status: AC
  Filled 2020-04-12: qty 10

## 2020-04-12 MED ORDER — PROPOFOL 10 MG/ML IV BOLUS
INTRAVENOUS | Status: AC
  Filled 2020-04-12: qty 20

## 2020-04-12 MED ORDER — GLYCOPYRROLATE 0.2 MG/ML IJ SOLN
INTRAMUSCULAR | Status: AC
  Filled 2020-04-12: qty 1

## 2020-04-12 MED ORDER — DEXAMETHASONE SODIUM PHOSPHATE 10 MG/ML IJ SOLN
INTRAMUSCULAR | Status: DC | PRN
  Administered 2020-04-12: 10 mg via INTRAVENOUS

## 2020-04-12 MED ORDER — LEVOFLOXACIN IN D5W 500 MG/100ML IV SOLN: 500.0000 mg | Freq: Once | INTRAVENOUS | Status: DC

## 2020-04-12 MED ORDER — FAMOTIDINE 20 MG PO TABS
ORAL_TABLET | ORAL | Status: AC
  Administered 2020-04-12: 20 mg via ORAL
  Filled 2020-04-12: qty 1

## 2020-04-12 MED ORDER — ACETAMINOPHEN 10 MG/ML IV SOLN
INTRAVENOUS | Status: DC | PRN
  Administered 2020-04-12: 1000 mg via INTRAVENOUS

## 2020-04-12 MED ORDER — FENTANYL CITRATE (PF) 100 MCG/2ML IJ SOLN
INTRAMUSCULAR | Status: AC
  Filled 2020-04-12: qty 2

## 2020-04-12 MED ORDER — FENTANYL CITRATE (PF) 100 MCG/2ML IJ SOLN
INTRAMUSCULAR | Status: DC | PRN
  Administered 2020-04-12: 25 ug via INTRAVENOUS
  Administered 2020-04-12: 50 ug via INTRAVENOUS
  Administered 2020-04-12: 25 ug via INTRAVENOUS

## 2020-04-12 MED ORDER — PHENYLEPHRINE HCL (PRESSORS) 10 MG/ML IV SOLN
INTRAVENOUS | Status: DC | PRN
  Administered 2020-04-12: 100 ug via INTRAVENOUS
  Administered 2020-04-12 (×2): 200 ug via INTRAVENOUS
  Administered 2020-04-12: 100 ug via INTRAVENOUS
  Administered 2020-04-12: 200 ug via INTRAVENOUS
  Administered 2020-04-12: 100 ug via INTRAVENOUS

## 2020-04-12 MED ORDER — MIDAZOLAM HCL 2 MG/2ML IJ SOLN
INTRAMUSCULAR | Status: AC
  Filled 2020-04-12: qty 2

## 2020-04-12 MED ORDER — EPHEDRINE SULFATE 50 MG/ML IJ SOLN
INTRAMUSCULAR | Status: DC | PRN
  Administered 2020-04-12 (×4): 10 mg via INTRAVENOUS
  Administered 2020-04-12: 15 mg via INTRAVENOUS
  Administered 2020-04-12: 10 mg via INTRAVENOUS

## 2020-04-12 MED ORDER — PHENYLEPHRINE HCL (PRESSORS) 10 MG/ML IV SOLN
INTRAVENOUS | Status: AC
  Filled 2020-04-12: qty 1

## 2020-04-12 MED ORDER — PROPOFOL 10 MG/ML IV BOLUS
INTRAVENOUS | Status: DC | PRN
  Administered 2020-04-12: 50 mg via INTRAVENOUS
  Administered 2020-04-12: 150 mg via INTRAVENOUS

## 2020-04-12 SURGICAL SUPPLY — 25 items
ADAPTER IRRIG TUBE 2 SPIKE SOL (ADAPTER) ×6 IMPLANT
BAG URINE DRAIN 2000ML AR STRL (UROLOGICAL SUPPLIES) ×3 IMPLANT
CATH FOL 2WAY LX 24X30 (CATHETERS) ×2 IMPLANT
CATH FOLEY 2WAY  5CC 20FR SIL (CATHETERS)
CATH FOLEY 2WAY 5CC 20FR SIL (CATHETERS) IMPLANT
CYSTOSCOPE CON FLOW (MISCELLANEOUS) ×2 IMPLANT
GLOVE BIO SURGEON STRL SZ7.5 (GLOVE) ×3 IMPLANT
GOWN STRL REUS W/ TWL LRG LVL3 (GOWN DISPOSABLE) ×1 IMPLANT
GOWN STRL REUS W/ TWL XL LVL3 (GOWN DISPOSABLE) ×1 IMPLANT
GOWN STRL REUS W/TWL LRG LVL3 (GOWN DISPOSABLE) ×2
GOWN STRL REUS W/TWL XL LVL3 (GOWN DISPOSABLE) ×2
IV NS 1000ML (IV SOLUTION) ×2
IV NS 1000ML BAXH (IV SOLUTION) ×1 IMPLANT
IV SET PRIMARY 15D 139IN B9900 (IV SETS) ×3 IMPLANT
KIT TURNOVER CYSTO (KITS) ×3 IMPLANT
LASER GREENLIGHT XPS PROCEDURE (MISCELLANEOUS) ×2 IMPLANT
LASER GRNLGT MOXY FIBER 750UM (MISCELLANEOUS) ×2 IMPLANT
LASER XPS ACCESS DROP OFF FEE (MISCELLANEOUS) ×2 IMPLANT
PACK CYSTO AR (MISCELLANEOUS) ×3 IMPLANT
SET IRRIG Y TYPE TUR BLADDER L (SET/KITS/TRAYS/PACK) ×3 IMPLANT
SOL .9 NS 3000ML IRR  AL (IV SOLUTION) ×16
SOL .9 NS 3000ML IRR UROMATIC (IV SOLUTION) ×4 IMPLANT
SURGILUBE 2OZ TUBE FLIPTOP (MISCELLANEOUS) ×3 IMPLANT
SYRINGE IRR TOOMEY STRL 70CC (SYRINGE) ×3 IMPLANT
WATER STERILE IRR 1000ML POUR (IV SOLUTION) ×3 IMPLANT

## 2020-04-12 NOTE — H&P (Signed)
Date of Initial H&P: 03/30/20  History reviewed, patient examined, no change in status, stable for surgery.

## 2020-04-12 NOTE — Anesthesia Procedure Notes (Signed)
Procedure Name: LMA Insertion Date/Time: 04/12/2020 7:28 AM Performed by: Doreen Salvage, CRNA Pre-anesthesia Checklist: Patient identified, Patient being monitored, Timeout performed, Emergency Drugs available and Suction available Patient Re-evaluated:Patient Re-evaluated prior to induction Oxygen Delivery Method: Circle system utilized Preoxygenation: Pre-oxygenation with 100% oxygen Induction Type: IV induction Ventilation: Mask ventilation without difficulty LMA: LMA inserted LMA Size: 4.0 Tube type: Oral Number of attempts: 1 Placement Confirmation: positive ETCO2 and breath sounds checked- equal and bilateral Tube secured with: Tape Dental Injury: Teeth and Oropharynx as per pre-operative assessment

## 2020-04-12 NOTE — Anesthesia Preprocedure Evaluation (Signed)
Anesthesia Evaluation  Patient identified by MRN, date of birth, ID band Patient awake    Reviewed: Allergy & Precautions, NPO status , Patient's Chart, lab work & pertinent test results  Airway Mallampati: II       Dental  (+) Teeth Intact, Implants   Pulmonary neg pulmonary ROS,    breath sounds clear to auscultation       Cardiovascular Exercise Tolerance: Good negative cardio ROS   Rhythm:Regular Rate:Normal     Neuro/Psych negative neurological ROS  negative psych ROS   GI/Hepatic negative GI ROS, Neg liver ROS,   Endo/Other  Hypothyroidism   Renal/GU negative Renal ROS     Musculoskeletal negative musculoskeletal ROS (+)   Abdominal Normal abdominal exam  (+)   Peds negative pediatric ROS (+)  Hematology  (+) anemia ,   Anesthesia Other Findings   Reproductive/Obstetrics                             Anesthesia Physical  Anesthesia Plan  ASA: II  Anesthesia Plan: General   Post-op Pain Management:    Induction: Intravenous  PONV Risk Score and Plan:   Airway Management Planned: LMA  Additional Equipment:   Intra-op Plan:   Post-operative Plan: Extubation in OR  Informed Consent: I have reviewed the patients History and Physical, chart, labs and discussed the procedure including the risks, benefits and alternatives for the proposed anesthesia with the patient or authorized representative who has indicated his/her understanding and acceptance.       Plan Discussed with: Surgeon  Anesthesia Plan Comments: (Some transient memory loss with inguinal hernia that resolved.)        Anesthesia Quick Evaluation

## 2020-04-12 NOTE — Transfer of Care (Signed)
Immediate Anesthesia Transfer of Care Note  Patient: Samuel [name]  Procedure(s) Performed: Procedure(s): GREEN LIGHT LASER TURP (TRANSURETHRAL RESECTION OF PROSTATE (N/A)  Patient Location: PACU  Anesthesia Type:General  Level of Consciousness: sedated  Airway & Oxygen Therapy: Patient Spontanous Breathing and Patient connected to face mask oxygen  Post-op Assessment: Report given to RN and Post -op Vital signs reviewed and stable  Post vital signs: Reviewed and stable  Last Vitals:  Vitals:   04/12/20 0628 04/12/20 0940  BP: (!) 151/95 99/62  Pulse:  77  Resp:  15  Temp:  36.4 C  SpO2:  [id number]    Complications: No apparent anesthesia complications

## 2020-04-12 NOTE — Progress Notes (Signed)
Patient received instruction on how to use the catheter irrigation system if urine flow stops moving through the catheter. Patient and daughter confirmed understanding of instruction.

## 2020-04-12 NOTE — Discharge Instructions (Addendum)
AMBULATORY SURGERY  DISCHARGE INSTRUCTIONS   1) The drugs that you were given will stay in your system until tomorrow so for the next 24 hours you should not:  A) Drive an automobile B) Make any legal decisions C) Drink any alcoholic beverage   2) You may resume regular meals tomorrow.  Today it is better to start with liquids and gradually work up to solid foods.  You may eat anything you prefer, but it is better to start with liquids, then soup and crackers, and gradually work up to solid foods.   3) Please notify your doctor immediately if you have any unusual bleeding, trouble breathing, redness and pain at the surgery site, drainage, fever, or pain not relieved by medication.    4) Additional Instructions:        Please contact your physician with any problems or Same Day Surgery at [phone], Monday through Friday 6 am to 4 pm, or [place] at [facility] number at [phone].         Benign Prostatic Hyperplasia  Benign prostatic hyperplasia (BPH) is an enlarged prostate gland that is caused by the normal aging process and not by cancer. The prostate is a walnut-sized gland that is involved in the production of semen. It is located in front of the rectum and below the bladder. The bladder stores urine and the urethra is the tube that carries the urine out of the body. The prostate may get bigger as a man gets older. An enlarged prostate can press on the urethra. This can make it harder to pass urine. The build-up of urine in the bladder can cause infection. Back pressure and infection may progress to bladder damage and kidney (renal) failure. What are the causes? This condition is part of a normal aging process. However, not all men develop problems from this condition. If the prostate enlarges away from the urethra, urine flow will not be blocked. If it enlarges toward the urethra and compresses it, there will be problems passing urine. What increases the  risk? This condition is more likely to develop in men over the age of 69 years. What are the signs or symptoms? Symptoms of this condition include:  Getting up often during the night to urinate.  Needing to urinate frequently during the day.  Difficulty starting urine flow.  Decrease in size and strength of your urine stream.  Leaking (dribbling) after urinating.  Inability to pass urine. This needs immediate treatment.  Inability to completely empty your bladder.  Pain when you pass urine. This is more common if there is also an infection.  Urinary tract infection (UTI). How is this diagnosed? This condition is diagnosed based on your medical history, a physical exam, and your symptoms. Tests will also be done, such as:  A post-void bladder scan. This measures any amount of urine that may remain in your bladder after you finish urinating.  A digital rectal exam. In a rectal exam, your health care provider checks your prostate by putting a lubricated, gloved finger into your rectum to feel the back of your prostate gland. This exam detects the size of your gland and any abnormal lumps or growths.  An exam of your urine (urinalysis).  A prostate specific antigen (PSA) screening. This is a blood test used to screen for prostate cancer.  An ultrasound. This test uses sound waves to electronically produce a picture of your prostate gland. Your health care provider may refer you to a specialist in kidney  and prostate diseases (urologist). How is this treated? Once symptoms begin, your health care provider will monitor your condition (active surveillance or watchful waiting). Treatment for this condition will depend on the severity of your condition. Treatment may include:  Observation and yearly exams. This may be the only treatment needed if your condition and symptoms are mild.  Medicines to relieve your symptoms, including: ? Medicines to shrink the prostate. ? Medicines to  relax the muscle of the prostate.  Surgery in severe cases. Surgery may include: ? Prostatectomy. In this procedure, the prostate tissue is removed completely through an open incision or with a laparoscope or robotics. ? Transurethral resection of the prostate (TURP). In this procedure, a tool is inserted through the opening at the tip of the penis (urethra). It is used to cut away tissue of the inner core of the prostate. The pieces are removed through the same opening of the penis. This removes the blockage. ? Transurethral incision (TUIP). In this procedure, small cuts are made in the prostate. This lessens the prostate's pressure on the urethra. ? Transurethral microwave thermotherapy (TUMT). This procedure uses microwaves to create heat. The heat destroys and removes a small amount of prostate tissue. ? Transurethral needle ablation (TUNA). This procedure uses radio frequencies to destroy and remove a small amount of prostate tissue. ? Interstitial laser coagulation ([place]). This procedure uses a laser to destroy and remove a small amount of prostate tissue. ? Transurethral electrovaporization (TUVP). This procedure uses electrodes to destroy and remove a small amount of prostate tissue. ? Prostatic urethral lift. This procedure inserts an implant to push the lobes of the prostate away from the urethra. Follow these instructions at home:  Take over-the-counter and prescription medicines only as told by your health care provider.  Monitor your symptoms for any changes. Contact your health care provider with any changes.  Avoid drinking large amounts of liquid before going to bed or out in public.  Avoid or reduce how much caffeine or alcohol you drink.  Give yourself time when you urinate.  Keep all follow-up visits as told by your health care provider. This is important. Contact a health care provider if:  You have unexplained back pain.  Your symptoms do not get better with  treatment.  You develop side effects from the medicine you are taking.  Your urine becomes very dark or has a bad smell.  Your lower abdomen becomes distended and you have trouble passing your urine. Get help right away if:  You have a fever or chills.  You suddenly cannot urinate.  You feel lightheaded, or very dizzy, or you faint.  There are large amounts of blood or clots in the urine.  Your urinary problems become hard to manage.  You develop moderate to severe low back or flank pain. The flank is the side of your body between the ribs and the hip. These symptoms may represent a serious problem that is an emergency. Do not wait to see if the symptoms will go away. Get medical help right away. Call your local emergency services (911 in the [place] Do not drive yourself to the hospital. Summary  Benign prostatic hyperplasia (BPH) is an enlarged prostate that is caused by the normal aging process and not by cancer.  An enlarged prostate can press on the urethra. This can make it hard to pass urine.  This condition is part of a normal aging process and is more likely to develop in men over the age  of 50 years.  Get help right away if you suddenly cannot urinate. This information is not intended to replace advice given to you by your health care provider. Make sure you discuss any questions you have with your health care provider. Document Revised: 11/02/2018 Document Reviewed: 01/12/2017 Elsevier Patient Education  [place] After This sheet gives you information about how to care for yourself after your procedure. Your health care provider may also give you more specific instructions. If you have problems or questions, contact your health care provider. What can I expect after the procedure? After the procedure, it is common to have:  Swelling and discomfort around your urethra. The opening of the urethra is at the end of  the penis.  Blood in your urine. This should go away after a few days.  Trouble urinating or sudden need to urinate (urgency). These problems should get better over time. You may continue to have a thin tube (catheter) inserted into your urethra to help drain your urine from your bladder for a few days after the procedure. Follow these instructions at home: Medicines  Take over-the-counter and prescription medicines only as told by your health care provider.  If you were prescribed an antibiotic medicine, take it as told by your health care provider. Do not stop taking the antibiotic even if you start to feel better. Bathing  Do not take baths, swim, or use a hot tub until your health care provider approves. Ask your health care provider if you may take showers. You may only be allowed to take sponge baths. Activity   Do not drive for 24 hours if you were given a medicine to help you relax (sedative) during your procedure.  Do not drive or use heavy machinery while taking prescription pain medicine.  Ask your health care provider what activities are safe for you. Most people can return to normal activities within a few days. ? Do not have sex or engage in sexual activity until your health care provider approves. ? Do not lift anything that is heavier than 10 lb (4.5 kg), or the limit that you are told, until your health care provider says that it is safe. General instructions      If you have a urinary catheter, care for it as told by your health care provider. This may include: ? Washing your hands before and after touching the catheter. ? Emptying your drainage bag when it is ?- full, or emptying it at least 2-3 times a day. ? Keeping the area around the catheter clean and dry. ? Avoiding any bends or breaks in the catheter. ? Keeping air out of the catheter. ? Making sure that the catheter is not placed under water.  Do not use any products that contain nicotine or tobacco,  such as cigarettes and e-cigarettes. If you need help quitting, ask your health care provider.  Drink enough fluid to keep your urine pale yellow.  Keep all follow-up visits as told by your health care provider. This is important. Contact a health care provider if:  You have trouble: ? Having a bowel movement. ? Getting an erection.  You have swelling around your urethra and it gets worse.  You have blood in your urine for more than 2 days after the procedure.  You have pain or burning when you urinate, or other problems that do not go away or cause discomfort.  You have problems with your catheter  or your catheter is blocked.  You have a fever.  You have nausea or you vomit.  You have swelling in your legs. Get help right away if:  Your urine has blood clots in it.  Your urine is dark red.  You cannot urinate after your catheter is removed.  You have blood in your stool.  You have severe pain that does not get better with medicine.  You have shortness of breath. Summary  After the procedure, it is common to have swelling and discomfort around your urethra and blood in your urine for a few days.  Some men may have problems urinating after this procedure. These problems should go away after a few days. If you have pain or burning while urinating, contact your health care provider.  If you have a catheter after this procedure, care for it as told by your health care provider.  If you have severe pain, dark red urine, or urine with blood clots, get medical help right away. This information is not intended to replace advice given to you by your health care provider. Make sure you discuss any questions you have with your health care provider. Document Revised: 03/31/2019 Document Reviewed: 06/18/2017 Elsevier Patient Education  [place] Laser Prostate Treatment  Green light laser therapy is a procedure that uses a high-energy laser to get rid  of extra prostate tissue by turning the tissue into a vapor. It is less invasive than traditional methods of prostate surgery, which involve cutting out the prostate tissue. Because the tissue is turned into a vapor (vaporized) rather than cut out, there is generally less blood loss. This surgery is used to treat an enlarged prostate gland (benign prostatic hyperplasia). Tell a health care provider about:  Any allergies you have.  All medicines you are taking, including vitamins, herbs, eye drops, creams, and over-the-counter medicines.  Any problems you or family members have had with anesthetic medicines.  Any blood disorders you have.  Any surgeries you have had.  Any medical conditions you have. What are the risks? Generally, this is a safe procedure. However, problems may occur, including:  Infection.  Bleeding.  Allergic reaction to medicines.  Damage to other structures or organs.  Blood in the urine (hematuria).  Painful urination.  Urinary tract infection.  Erectile dysfunction (rare).  Dry ejaculation.  Scar tissue in the urinary passage. What happens before the procedure? Staying hydrated Follow instructions from your health care provider about hydration, which may include:  Up to 2 hours before the procedure - you may continue to drink clear liquids, such as water, clear fruit juice, black coffee, and plain tea. Eating and drinking restrictions Follow instructions from your health care provider about eating and drinking, which may include:  8 hours before the procedure - stop eating heavy meals or foods such as meat, fried foods, or fatty foods.  6 hours before the procedure - stop eating light meals or foods, such as toast or cereal.  6 hours before the procedure - stop drinking milk or drinks that contain milk.  2 hours before the procedure - stop drinking clear liquids. Medicines  Ask your health care provider about: ? Changing or stopping your  regular medicines. This is especially important if you are taking diabetes medicines or blood thinners. ? Taking medicines such as aspirin and ibuprofen. These medicines can thin your blood. Do not take these medicines before your procedure if your health care provider instructs you not  to.  You may be prescribed antibiotic medicine. If so, take your antibiotic as told by your health care provider. Do not stop taking the antibiotic even if you start to feel better. General instructions  Plan to have someone take you home from the hospital or clinic.  If you will be going home right after the procedure, plan to have someone with you for 24 hours. What happens during the procedure?  To reduce your risk of infection: ? Your health care team will wash or sanitize their hands. ? Your skin will be washed with soap.  An IV will be inserted into one of your veins.  You will be given one or more of the following: ? A medicine to help you relax (sedative). ? A medicine to make you fall asleep (general anesthetic). ? A medicine that is injected into your spine to numb the area below and slightly above the injection site (spinal anesthetic).  A tube containing viewing scopes and instruments (fiber-optic scope) will be passed through the urethra and into the prostate. The opening of the urethra is at the end of the penis.  A thin fiber will be put through the tube and positioned next to the extra prostate tissue.  Pulses of laser light will come from the end of the fiber and be projected onto the extra tissue. Your blood will absorb the light, become hot, and vaporize the extra prostate tissue.  The heat from the laser beam will seal off the blood vessels, which will lessen bleeding.  The fiber-optic scope will be removed.  A thin tube will be inserted into the urethra to drain your urine (urinary catheter). The procedure may vary among health care providers and hospitals. What happens after the  procedure?  Your blood pressure, heart rate, breathing rate, and blood oxygen level will be monitored until the medicines you were given have worn off.  Depending on factors such as the amount of prostate tissue that was vaporized, the strength of your bladder, and the amount of bleeding expected, your catheter may be removed before you go home.  You may be given elastic support stockings to wear to help prevent blood clots in your legs.  Do not drive for 24 hours if you were given a sedative, or for as long as directed by your health care provider. Summary  Green light laser therapy is a procedure that uses a high-energy laser that turns extra prostate tissue into a vapor.  This procedure is less invasive than traditional methods of prostate surgery.  Follow instructions from your health care provider about eating and drinking before the procedure.  Pulses of laser light will come from the end of a thin fiber and be aimed at the extra prostate tissue. Your blood will absorb the light, become hot, and vaporize the extra tissue. This information is not intended to replace advice given to you by your health care provider. Make sure you discuss any questions you have with your health care provider. Document Revised: 03/31/2019 Document Reviewed: 12/27/2016 Elsevier Patient Education  [place], Adult An indwelling urinary catheter is a thin, flexible, germ-free (sterile) tube that is placed into the bladder to help drain urine out of the body. The catheter is inserted into the part of the body that drains urine from the bladder (urethra). Urine drains from the catheter into a drainage bag outside of the body. Taking good care of your catheter will keep it working properly and  help to prevent problems from developing. What are the risks?  Bacteria may get into your bladder and cause a urinary tract infection.  Urine flow can become blocked. This  can happen if the catheter is not working correctly, or if you have sediment or a blood clot in your bladder or the catheter.  Tissue near the catheter may become irritated and bleed. How to wear your catheter and your drainage bag Supplies needed  Adhesive tape or a leg strap.  Alcohol wipe or soap and water (if you use tape).  A clean towel (if you use tape).  Overnight drainage bag.  Smaller drainage bag (leg bag). Wearing your catheter and bag Use adhesive tape or a leg strap to attach your catheter to your leg.  Make sure the catheter is not pulled tight.  If a leg strap gets wet, replace it with a dry one.  If you use adhesive tape: 1. Use an alcohol wipe or soap and water to wash off any stickiness on your skin where you had tape before. 2. Use a clean towel to pat-dry the area. 3. Apply the new tape. You should have received a large overnight drainage bag and a smaller leg bag that fits underneath clothing.  You may wear the overnight bag at any time, but you should not wear the leg bag at night.  Always wear the leg bag below your knee.  Make sure the overnight drainage bag is always lower than the level of your bladder, but do not let it touch the floor. Before you go to sleep, hang the bag inside a wastebasket that is covered by a clean plastic bag. How to care for your skin around the catheter     Supplies needed  A clean washcloth.  Water and mild soap.  A clean towel. Caring for your skin and catheter  Every day, use a clean washcloth and soapy water to clean the skin around your catheter. 1. Wash your hands with soap and water. 2. Wet a washcloth in warm water and mild soap. 3. Clean the skin around your urethra.  If you are male:  Use one hand to gently spread the folds of skin around your vagina (labia).  With the washcloth in your other hand, wipe the inner side of your labia on each side. Do this in a front-to-back direction.  If you are  male:  Use one hand to pull back any skin that covers the end of your penis (foreskin).  With the washcloth in your other hand, wipe your penis in small circles. Start wiping at the tip of your penis, then move outward from the catheter.  Move the foreskin back in place, if this applies. 4. With your free hand, hold the catheter close to where it enters your body. Keep holding the catheter during cleaning so it does not get pulled out. 5. Use your other hand to clean the catheter with the washcloth.  Only wipe downward on the catheter.  Do not wipe upward toward your body, because that may push bacteria into your urethra and cause infection. 6. Use a clean towel to pat-dry the catheter and the skin around it. Make sure to wipe off all soap. 7. Wash your hands with soap and water.  Shower every day. Do not take baths.  Do not use cream, ointment, or lotion on the area where the catheter enters your body, unless your health care provider tells you to do that.  Do not use  powders, sprays, or lotions on your genital area.  Check your skin around the catheter every day for signs of infection. Check for: ? Redness, swelling, or pain. ? Fluid or blood. ? Warmth. ? Pus or a bad smell. How to empty the drainage bag Supplies needed  Rubbing alcohol.  Gauze pad or cotton ball.  Adhesive tape or a leg strap. Emptying the bag Empty your drainage bag (your overnight drainage bag or your leg bag) when it is ?- full, or at least 2-3 times a day. Clean the drainage bag according to the manufacturer's instructions or as told by your health care provider. 1. Wash your hands with soap and water. 2. Detach the drainage bag from your leg. 3. Hold the drainage bag over the toilet or a clean container. Make sure the drainage bag is lower than your hips and bladder. This stops urine from going back into the tubing and into your bladder. 4. Open the pour spout at the bottom of the bag. 5. Empty the  urine into the toilet or container. Do not let the pour spout touch any surface. This precaution is important to prevent bacteria from getting in the bag and causing infection. 6. Apply rubbing alcohol to a gauze pad or cotton ball. 7. Use the gauze pad or cotton ball to clean the pour spout. 8. Close the pour spout. 9. Attach the bag to your leg with adhesive tape or a leg strap. 10. Wash your hands with soap and water. How to change the drainage bag Supplies needed:  Alcohol wipes.  A clean drainage bag.  Adhesive tape or a leg strap. Changing the bag Replace your drainage bag with a clean bag if it leaks, starts to smell bad, or looks dirty. 1. Wash your hands with soap and water. 2. Detach the dirty drainage bag from your leg. 3. Pinch the catheter with your fingers so that urine does not spill out. 4. Disconnect the catheter tube from the drainage tube at the connection valve. Do not let the tubes touch any surface. 5. Clean the end of the catheter tube with an alcohol wipe. Use a different alcohol wipe to clean the end of the drainage tube. 6. Connect the catheter tube to the drainage tube of the clean bag. 7. Attach the clean bag to your leg with adhesive tape or a leg strap. Avoid attaching the new bag too tightly. 8. Wash your hands with soap and water. General instructions   Never pull on your catheter or try to remove it. Pulling can damage your internal tissues.  Always wash your hands before and after you handle your catheter or drainage bag. Use a mild, fragrance-free soap. If soap and water are not available, use hand sanitizer.  Always make sure there are no twists or bends (kinks) in the catheter tube.  Always make sure there are no leaks in the catheter or drainage bag.  Drink enough fluid to keep your urine pale yellow.  Do not take baths, swim, or use a hot tub.  If you are male, wipe from front to back after having a bowel movement. Contact a health care  provider if:  Your urine is cloudy.  Your urine smells unusually bad.  Your catheter gets clogged.  Your catheter starts to leak.  Your bladder feels full. Get help right away if:  You have redness, swelling, or pain where the catheter enters your body.  You have fluid, blood, pus, or a bad smell coming from  the area where the catheter enters your body.  The area where the catheter enters your body feels warm to the touch.  You have a fever.  You have pain in your abdomen, legs, lower back, or bladder.  You see blood in the catheter.  Your urine is pink or red.  You have nausea, vomiting, or chills.  Your urine is not draining into the bag.  Your catheter gets pulled out. Summary  An indwelling urinary catheter is a thin, flexible, germ-free (sterile) tube that is placed into the bladder to help drain urine out of the body.  The catheter is inserted into the part of the body that drains urine from the bladder (urethra).  Take good care of your catheter to keep it working properly and help prevent problems from developing.  Always wash your hands before and after you handle your catheter or drainage bag.  Never pull on your catheter or try to remove it. This information is not intended to replace advice given to you by your health care provider. Make sure you discuss any questions you have with your health care provider. Document Revised: 04/01/2019 Document Reviewed: 07/24/2017 Elsevier Patient Education  [place].

## 2020-04-12 NOTE — Op Note (Signed)
Preoperative diagnosis: BPH with bladder outlet obstruction  Postoperative diagnosis: Same  Procedure: Photo vaporization of the prostate with greenlight laser  Surgeon: Otelia Limes. Yves Dill MD  Anesthesia: General  Indications:See the history and physical. After informed consent the above procedure(s) were requested     Technique and findings: After adequate general anesthesia had been obtained the patient was placed into dorsal lithotomy position and the perineum was prepped and draped in the usual fashion.  The 21 French laser scope was coupled with the camera and visually advanced into the bladder.  Bladder was definitely trabeculated with cellules present.  Trilobar BPH was present with intravesical growth of the median lobe.  The XPS greenlight laser fiber was introduced through the scope and set at 97 W.  Median lobe and bladder neck tissue was vaporized.  Power was then increased up to 120 W and obstructive tissue from the bladder neck to the verumontanum was vaporized.  Power was further increased to 180 W and remaining obstructive tissue vaporized.  Bleeders were then coagulated with the coagulative setting.  The laser scope was then removed and 10 cc of viscous Xylocaine instilled within the urethra.  11 French Foley catheter was placed and irrigated until clear.  A B&O suppository was placed.  Blood loss was 100 cc.  The procedure was then terminated and patient transferred to the recovery room in stable condition.

## 2020-04-16 NOTE — Anesthesia Postprocedure Evaluation (Signed)
Anesthesia Post Note  Patient: Samuel [name]  Procedure(s) Performed: GREEN LIGHT LASER TURP (TRANSURETHRAL RESECTION OF PROSTATE (N/A )  Patient location during evaluation: PACU Anesthesia Type: General Level of consciousness: awake and alert and oriented Pain management: pain level controlled Vital Signs Assessment: post-procedure vital signs reviewed and stable Respiratory status: spontaneous breathing Cardiovascular status: blood pressure returned to baseline Anesthetic complications: no     Last Vitals:  Vitals:   04/12/20 1031 04/12/20 1047  BP: 128/71 125/66  Pulse: 85 92  Resp: 16 16  Temp: 36.6 C   SpO2: 98% 97%    Last Pain:  Vitals:   04/12/20 1047  TempSrc:   PainSc: 0-No pain                 Rock Sobol

## 2021-04-03 ENCOUNTER — Other Ambulatory Visit: Payer: Self-pay

## 2021-04-03 ENCOUNTER — Encounter: Payer: Self-pay | Admitting: Ophthalmology

## 2021-04-08 NOTE — Discharge Instructions (Signed)

## 2021-04-09 ENCOUNTER — Encounter: Payer: Self-pay | Admitting: Ophthalmology

## 2021-04-09 ENCOUNTER — Encounter
Admission: RE | Disposition: A | Discharge status: Home / Self Care | Payer: Self-pay | Source: Home / Self Care | Attending: Ophthalmology

## 2021-04-09 ENCOUNTER — Ambulatory Visit
Admission: RE | Admit: 2021-04-09 | Discharge: 2021-04-09 | Disposition: A | Discharge status: Home / Self Care | Payer: Medicare Other | Attending: Ophthalmology | Admitting: Ophthalmology

## 2021-04-09 ENCOUNTER — Other Ambulatory Visit: Payer: Self-pay

## 2021-04-09 ENCOUNTER — Ambulatory Visit: Payer: Medicare Other | Admitting: Anesthesiology

## 2021-04-09 DIAGNOSIS — Z79890 Hormone replacement therapy (postmenopausal): Secondary | ICD-10-CM | POA: Insufficient documentation

## 2021-04-09 DIAGNOSIS — Z85828 Personal history of other malignant neoplasm of skin: Secondary | ICD-10-CM | POA: Diagnosis not present

## 2021-04-09 DIAGNOSIS — H2512 Age-related nuclear cataract, left eye: Secondary | ICD-10-CM | POA: Diagnosis present

## 2021-04-09 HISTORY — DX: Unspecified osteoarthritis, unspecified site: M19.90

## 2021-04-09 HISTORY — DX: Dizziness and giddiness: R42

## 2021-04-09 HISTORY — PX: CATARACT EXTRACTION W/PHACO: SHX586

## 2021-04-09 SURGERY — PHACOEMULSIFICATION, CATARACT, WITH IOL INSERTION
Anesthesia: Monitor Anesthesia Care | Site: Eye | Laterality: Left

## 2021-04-09 MED ORDER — ACETAMINOPHEN 160 MG/5ML PO SOLN: 325.0000 mg | ORAL | Status: DC | PRN

## 2021-04-09 MED ORDER — EPINEPHRINE PF 1 MG/ML IJ SOLN
INTRAOCULAR | Status: DC | PRN
  Administered 2021-04-09: 48 mL via OPHTHALMIC

## 2021-04-09 MED ORDER — ONDANSETRON HCL 4 MG/2ML IJ SOLN: 4.0000 mg | Freq: Once | INTRAMUSCULAR | Status: DC | PRN

## 2021-04-09 MED ORDER — ARMC OPHTHALMIC DILATING DROPS
1.0000 "application " | OPHTHALMIC | Status: DC | PRN
  Administered 2021-04-09 (×3): 1 via OPHTHALMIC

## 2021-04-09 MED ORDER — ACETAMINOPHEN 325 MG PO TABS: 325.0000 mg | ORAL_TABLET | ORAL | Status: DC | PRN

## 2021-04-09 MED ORDER — MOXIFLOXACIN HCL 0.5 % OP SOLN
OPHTHALMIC | Status: DC | PRN
  Administered 2021-04-09: 0.2 mL via OPHTHALMIC

## 2021-04-09 MED ORDER — LIDOCAINE HCL (PF) 2 % IJ SOLN
INTRAOCULAR | Status: DC | PRN
  Administered 2021-04-09: 1 mL

## 2021-04-09 MED ORDER — FENTANYL CITRATE (PF) 100 MCG/2ML IJ SOLN
INTRAMUSCULAR | Status: DC | PRN
  Administered 2021-04-09: 100 ug via INTRAVENOUS

## 2021-04-09 MED ORDER — NA CHONDROIT SULF-NA HYALURON 40-17 MG/ML IO SOLN
INTRAOCULAR | Status: DC | PRN
  Administered 2021-04-09: 1 mL via INTRAOCULAR

## 2021-04-09 MED ORDER — BRIMONIDINE TARTRATE-TIMOLOL 0.2-0.5 % OP SOLN
OPHTHALMIC | Status: DC | PRN
  Administered 2021-04-09: 1 [drp] via OPHTHALMIC

## 2021-04-09 MED ORDER — TETRACAINE HCL 0.5 % OP SOLN
1.0000 [drp] | OPHTHALMIC | Status: DC | PRN
  Administered 2021-04-09 (×3): 1 [drp] via OPHTHALMIC

## 2021-04-09 MED ORDER — MIDAZOLAM HCL 2 MG/2ML IJ SOLN
INTRAMUSCULAR | Status: DC | PRN
  Administered 2021-04-09: .5 mg via INTRAVENOUS

## 2021-04-09 SURGICAL SUPPLY — 19 items
CANNULA ANT/CHMB 27GA (MISCELLANEOUS) ×4 IMPLANT
GLOVE SURG LX 8.0 MICRO (GLOVE) ×1
GLOVE SURG LX STRL 8.0 MICRO (GLOVE) ×1 IMPLANT
GLOVE SURG TRIUMPH 8.0 PF LTX (GLOVE) ×6 IMPLANT
GOWN STRL REUS W/ TWL LRG LVL3 (GOWN DISPOSABLE) ×2 IMPLANT
GOWN STRL REUS W/TWL LRG LVL3 (GOWN DISPOSABLE) ×4
LENS IOL ACRYSOF IQ 21.0 (Intraocular Lens) ×2 IMPLANT
MARKER SKIN DUAL TIP RULER LAB (MISCELLANEOUS) ×2 IMPLANT
NEEDLE FILTER BLUNT 18X 1/2SAF (NEEDLE) ×1
NEEDLE FILTER BLUNT 18X1 1/2 (NEEDLE) ×1 IMPLANT
PACK EYE AFTER SURG (MISCELLANEOUS) ×2 IMPLANT
PACK OPTHALMIC (MISCELLANEOUS) ×2 IMPLANT
PACK PORFILIO (MISCELLANEOUS) ×2 IMPLANT
SUT ETHILON 10-0 CS-B-6CS-B-6 (SUTURE)
SUTURE EHLN 10-0 CS-B-6CS-B-6 (SUTURE) IMPLANT
SYR 3ML LL SCALE MARK (SYRINGE) ×2 IMPLANT
SYR TB 1ML LUER SLIP (SYRINGE) ×2 IMPLANT
WATER STERILE IRR 250ML POUR (IV SOLUTION) ×2 IMPLANT
WIPE NON LINTING 3.25X3.25 (MISCELLANEOUS) ×2 IMPLANT

## 2021-04-09 NOTE — Op Note (Signed)
PREOPERATIVE DIAGNOSIS:  Nuclear sclerotic cataract of the left eye.   POSTOPERATIVE DIAGNOSIS:  Nuclear sclerotic cataract of the left eye.   OPERATIVE PROCEDURE:@   SURGEON:  Birder Robson, MD.   ANESTHESIA:  Anesthesiologist: Veda Canning, MD CRNA: Silvana Newness, CRNA  1.      Managed anesthesia care. 2.     0.84ml of Shugarcaine was instilled following the paracentesis   COMPLICATIONS:  None.   TECHNIQUE:   Stop and chop   DESCRIPTION OF PROCEDURE:  The patient was examined and consented in the preoperative holding area where the aforementioned topical anesthesia was applied to the left eye and then brought back to the Operating Room where the left eye was prepped and draped in the usual sterile ophthalmic fashion and a lid speculum was placed. A paracentesis was created with the side port blade and the anterior chamber was filled with viscoelastic. A near clear corneal incision was performed with the steel keratome. A continuous curvilinear capsulorrhexis was performed with a cystotome followed by the capsulorrhexis forceps. Hydrodissection and hydrodelineation were carried out with BSS on a blunt cannula. The lens was removed in a stop and chop  technique and the remaining cortical material was removed with the irrigation-aspiration handpiece. The capsular bag was inflated with viscoelastic and the Technis ZCB00 lens was placed in the capsular bag without complication. The remaining viscoelastic was removed from the eye with the irrigation-aspiration handpiece. The wounds were hydrated. The anterior chamber was flushed with BSS and the eye was inflated to physiologic pressure. 0.25ml Vigamox was placed in the anterior chamber. The wounds were found to be water tight. The eye was dressed with Combigan. The patient was given protective glasses to wear throughout the day and a shield with which to sleep tonight. The patient was also given drops with which to begin a drop regimen today and  will follow-up with me in one day. Implant Name Type Inv. Item Serial No. Manufacturer Lot No. LRB No. Used Action  LENS IOL ACRYSOF IQ 21.0 - [id number] Intraocular Lens LENS IOL ACRYSOF IQ 21.0 [id number] ALCON  Left 1 Implanted    Procedure(s) with comments: CATARACT EXTRACTION PHACO AND INTRAOCULAR LENS PLACEMENT (IOC) LEFT (Left) - 4.23 0:29.0  Electronically signed: Birder Robson 04/09/2021 9:00 AM

## 2021-04-09 NOTE — H&P (Signed)
[facility]   Primary Care Physician:  Pcp, No Ophthalmologist: Dr. George Ina  Pre-Procedure History & Physical: HPI:  Samuel [name] is a 70 y.o. male here for cataract surgery.   Past Medical History:  Diagnosis Date  . Anemia    h/o  . Arthritis    spine  . Basal cell carcinoma of face 2000  . Colon polyp   . Complication of anesthesia    memory loss  . Hypothyroidism   . Vertigo    no episodes for over 25 yrs    Past Surgical History:  Procedure Laterality Date  . BASAL CELL CARCINOMA EXCISION  2000   face  . BIOPSY THYROID Left 1993  . CATARACT EXTRACTION Right 2009  . COLONOSCOPY  2011  . COLONOSCOPY WITH PROPOFOL N/A 12/03/2016   Procedure: COLONOSCOPY WITH PROPOFOL;  Surgeon: Robert Bellow, MD;  Location: [facility] ENDOSCOPY;  Service: Endoscopy;  Laterality: N/A;  . dental implant  08-03-15  . DENTAL SURGERY    . GREEN LIGHT LASER TURP (TRANSURETHRAL RESECTION OF PROSTATE N/A 04/12/2020   Procedure: GREEN LIGHT LASER TURP (TRANSURETHRAL RESECTION OF PROSTATE;  Surgeon: Royston Cowper, MD;  Location: [facility] ORS;  Service: Urology;  Laterality: N/A;  . HERNIA REPAIR    . INGUINAL HERNIA REPAIR Right 08/08/2015   Procedure: HERNIA REPAIR INGUINAL ADULT;  Surgeon: Robert Bellow, MD;  Location: [facility] ORS;  Service: General;  Laterality: Right;  . INGUINAL HERNIA REPAIR Left 02/28/2016   Procedure: HERNIA REPAIR INGUINAL ADULT;  Surgeon: Robert Bellow, MD;  Location: [facility] ORS;  Service: General;  Laterality: Left;  . THYROID LOBECTOMY Left 1993   benign  . TONSILLECTOMY  1956    Prior to Admission medications   Medication Sig Start Date End Date Taking? Authorizing Provider  MULTIPLE VITAMIN PO Take 1 tablet by mouth every morning.   Yes [provider]  SYNTHROID 112 MCG tablet TAKE 1 TABLET EVERY DAY ON EMPTY [place] A GLASS OF WATER AT LEAST 30-60 [place] BREAKFAST Patient taking differently: Take 112 mcg by mouth daily before breakfast.  11/23/19  Yes Parks Ranger, Devonne Doughty, DO    Allergies as of 03/18/2021  . (No Known Allergies)    Family History  Problem Relation Age of Onset  . Cancer Mother        uterine  . Congestive Heart Failure Mother   . Hypertension Mother   . CVA Father   . Hypertension Father     Social History   Socioeconomic History  . Marital status: Divorced    Spouse name: Not on file  . Number of children: Not on file  . Years of education: Physician  . Highest education level: Not on file  Occupational History  . Occupation: Restaurant manager, fast food    CommentTheatre stage manager, Mining engineer. Also performs accupuncture  Tobacco Use  . Smoking status: Never Smoker  . Smokeless tobacco: Never Used  Vaping Use  . Vaping Use: Never used  Substance and Sexual Activity  . Alcohol use: No    Alcohol/week: 0.0 standard drinks  . Drug use: No  . Sexual activity: Not on file  Other Topics Concern  . Not on file  Social History Narrative   [place]   Social Determinants of Health   Financial Resource Strain: Not on file  Food Insecurity: Not on file  Transportation Needs: Not on file  Physical Activity: Not on file  Stress: Not on file  Social Connections: Not on  file  Intimate Partner Violence: Not on file    Review of Systems: See HPI, otherwise negative ROS  Physical Exam: BP (!) 182/100   Pulse 78   Temp 97.6 F (36.4 C)   Ht 6\' 1"  (1.854 m)   Wt 79.4 kg   SpO2 99%   BMI 23.09 kg/m  General:   Alert,  pleasant and cooperative in NAD Head:  Normocephalic and atraumatic. Respiratory:  Normal work of breathing. Cardiovascular:  RRR  Impression/Plan: Samuel [name] is here for cataract surgery.  Risks, benefits, limitations, and alternatives regarding cataract surgery have been reviewed with the patient.  Questions have been answered.  All parties agreeable.   Birder Robson, MD  04/09/2021, 8:35 AM

## 2021-04-09 NOTE — Anesthesia Postprocedure Evaluation (Signed)
Anesthesia Post Note  Patient: Samuel [name]  Procedure(s) Performed: CATARACT EXTRACTION PHACO AND INTRAOCULAR LENS PLACEMENT (IOC) LEFT (Left Eye)     Patient location during evaluation: PACU Anesthesia Type: MAC Level of consciousness: awake Pain management: pain level controlled Vital Signs Assessment: post-procedure vital signs reviewed and stable Respiratory status: respiratory function stable Cardiovascular status: stable Postop Assessment: no apparent nausea or vomiting Anesthetic complications: no   No complications documented.  Veda Canning

## 2021-04-09 NOTE — Anesthesia Procedure Notes (Signed)
Procedure Name: MAC Date/Time: 04/09/2021 8:45 AM Performed by: Silvana Newness, CRNA Pre-anesthesia Checklist: Patient identified, Emergency Drugs available, Suction available, Patient being monitored and Timeout performed Patient Re-evaluated:Patient Re-evaluated prior to induction Oxygen Delivery Method: Nasal cannula Placement Confirmation: positive ETCO2

## 2021-04-09 NOTE — Transfer of Care (Signed)
Immediate Anesthesia Transfer of Care Note  Patient: Samuel [name]  Procedure(s) Performed: CATARACT EXTRACTION PHACO AND INTRAOCULAR LENS PLACEMENT (IOC) LEFT (Left Eye)  Patient Location: PACU  Anesthesia Type: MAC  Level of Consciousness: awake, alert  and patient cooperative  Airway and Oxygen Therapy: Patient Spontanous Breathing and Patient connected to supplemental oxygen  Post-op Assessment: Post-op Vital signs reviewed, Patient's Cardiovascular Status Stable, Respiratory Function Stable, Patent Airway and No signs of Nausea or vomiting  Post-op Vital Signs: Reviewed and stable  Complications: No complications documented.

## 2021-04-09 NOTE — Anesthesia Preprocedure Evaluation (Addendum)
Anesthesia Evaluation  Patient identified by MRN, date of birth, ID band Patient awake    Reviewed: Allergy & Precautions, NPO status   Airway Mallampati: II  TM Distance: >3 FB     Dental   Pulmonary    Pulmonary exam normal        Cardiovascular  Rhythm:Regular Rate:Normal     Neuro/Psych    GI/Hepatic   Endo/Other  Hypothyroidism   Renal/GU      Musculoskeletal  (+) Arthritis ,   Abdominal   Peds  Hematology  (+) anemia ,   Anesthesia Other Findings   Reproductive/Obstetrics                             Anesthesia Physical Anesthesia Plan  ASA: II  Anesthesia Plan: MAC   Post-op Pain Management:    Induction: Intravenous  PONV Risk Score and Plan: TIVA, Midazolam and Treatment may vary due to age or medical condition  Airway Management Planned: Natural Airway and Nasal Cannula  Additional Equipment:   Intra-op Plan:   Post-operative Plan:   Informed Consent: I have reviewed the patients History and Physical, chart, labs and discussed the procedure including the risks, benefits and alternatives for the proposed anesthesia with the patient or authorized representative who has indicated his/her understanding and acceptance.       Plan Discussed with: CRNA  Anesthesia Plan Comments:         Anesthesia Quick Evaluation

## 2021-04-10 ENCOUNTER — Encounter: Payer: Self-pay | Admitting: Ophthalmology

## 2021-05-01 ENCOUNTER — Other Ambulatory Visit: Payer: Self-pay | Admitting: Urology

## 2021-05-01 DIAGNOSIS — R7989 Other specified abnormal findings of blood chemistry: Secondary | ICD-10-CM

## 2021-05-16 ENCOUNTER — Other Ambulatory Visit: Payer: Self-pay | Admitting: Urology

## 2021-05-16 DIAGNOSIS — E278 Other specified disorders of adrenal gland: Secondary | ICD-10-CM

## 2021-05-16 DIAGNOSIS — R7989 Other specified abnormal findings of blood chemistry: Secondary | ICD-10-CM

## 2021-05-16 DIAGNOSIS — R1011 Right upper quadrant pain: Secondary | ICD-10-CM

## 2021-05-17 ENCOUNTER — Other Ambulatory Visit: Payer: Self-pay

## 2021-05-17 ENCOUNTER — Ambulatory Visit
Admission: RE | Admit: 2021-05-17 | Discharge: 2021-05-17 | Disposition: A | Discharge status: Home / Self Care | Payer: Medicare Other | Source: Ambulatory Visit | Attending: Urology | Admitting: Urology

## 2021-05-17 DIAGNOSIS — R1011 Right upper quadrant pain: Secondary | ICD-10-CM

## 2021-05-17 DIAGNOSIS — R7989 Other specified abnormal findings of blood chemistry: Secondary | ICD-10-CM | POA: Diagnosis present

## 2021-05-17 DIAGNOSIS — R1012 Left upper quadrant pain: Secondary | ICD-10-CM | POA: Diagnosis present

## 2021-05-27 ENCOUNTER — Other Ambulatory Visit: Payer: Self-pay | Admitting: Urology

## 2021-05-27 ENCOUNTER — Other Ambulatory Visit (HOSPITAL_COMMUNITY): Payer: Self-pay | Admitting: Urology

## 2021-05-27 DIAGNOSIS — R7989 Other specified abnormal findings of blood chemistry: Secondary | ICD-10-CM

## 2021-05-27 DIAGNOSIS — R891 Abnormal level of hormones in specimens from other organs, systems and tissues: Secondary | ICD-10-CM

## 2021-06-07 ENCOUNTER — Other Ambulatory Visit: Payer: Self-pay

## 2021-06-07 ENCOUNTER — Ambulatory Visit (HOSPITAL_COMMUNITY)
Admission: RE | Admit: 2021-06-07 | Discharge: 2021-06-07 | Disposition: A | Discharge status: Home / Self Care | Payer: Medicare Other | Source: Ambulatory Visit | Attending: Urology | Admitting: Urology

## 2021-06-07 DIAGNOSIS — R891 Abnormal level of hormones in specimens from other organs, systems and tissues: Secondary | ICD-10-CM | POA: Diagnosis present

## 2021-06-07 DIAGNOSIS — R7989 Other specified abnormal findings of blood chemistry: Secondary | ICD-10-CM | POA: Diagnosis present

## 2021-09-16 ENCOUNTER — Other Ambulatory Visit: Payer: Self-pay | Admitting: Urology

## 2021-09-16 DIAGNOSIS — N442 Benign cyst of testis: Secondary | ICD-10-CM

## 2021-09-24 ENCOUNTER — Other Ambulatory Visit: Payer: Self-pay

## 2021-09-24 ENCOUNTER — Ambulatory Visit
Admission: RE | Admit: 2021-09-24 | Discharge: 2021-09-24 | Disposition: A | Discharge status: Home / Self Care | Payer: Medicare Other | Source: Ambulatory Visit | Attending: Urology | Admitting: Urology

## 2021-09-24 DIAGNOSIS — N442 Benign cyst of testis: Secondary | ICD-10-CM | POA: Insufficient documentation

## 2023-05-15 IMAGING — CT CT ABD-PELV W/O CM
2 of 4 series · 15 of 46 positions shown, 17 images · non-contrast
Comparison: None.

CLINICAL DATA: Elevated testosterone and FSH, evaluate for possible
adrenal mass

EXAM:
CT ABDOMEN AND PELVIS WITHOUT CONTRAST
TECHNIQUE: Multidetector CT imaging of the abdomen and pelvis was performed
following the standard protocol without IV contrast.

[Series 2: axials routine abdomen pelvis without 5.00 · axial · non-contrast · 0.70mm/px · z∈[-1609,-1149]mm · 12 of 102 slices shown, 14 images]
[im 5/102  soft-tissue]
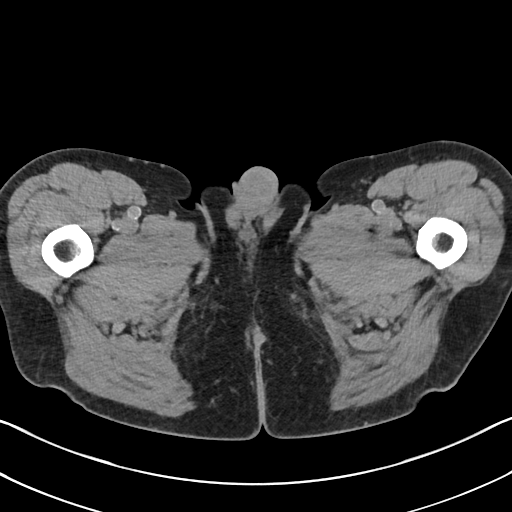
[im 5/102  bone]
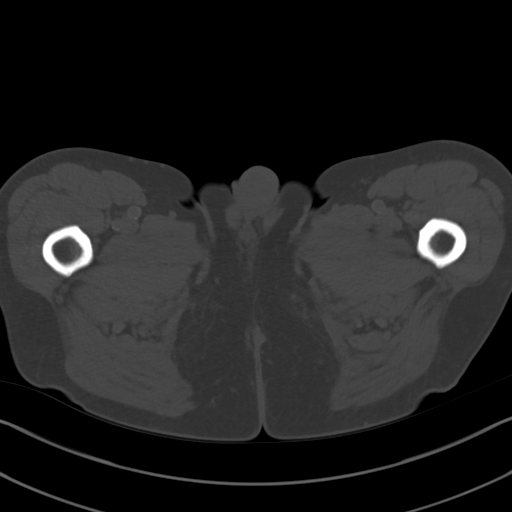
[im 13/102  soft-tissue]
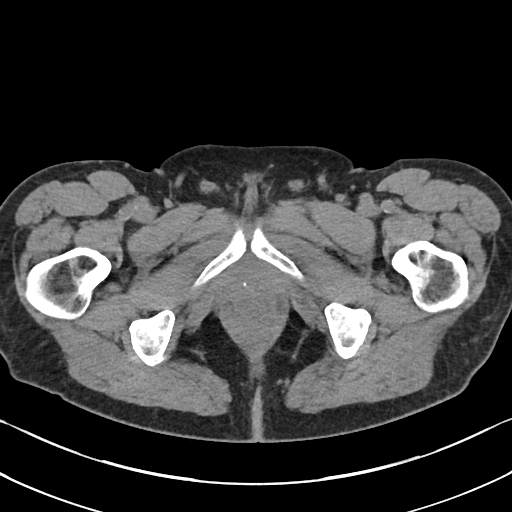
[im 22/102  soft-tissue]
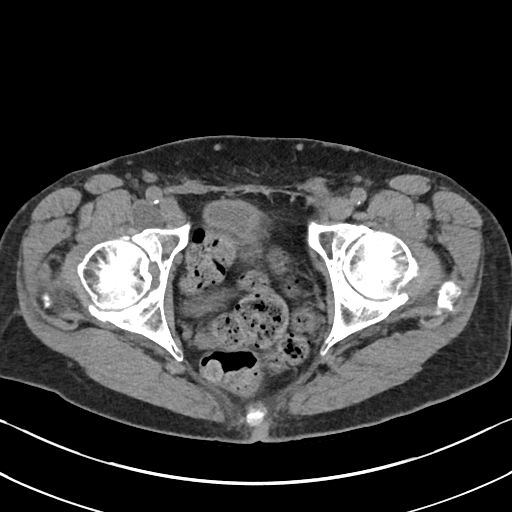
[im 30/102  soft-tissue]
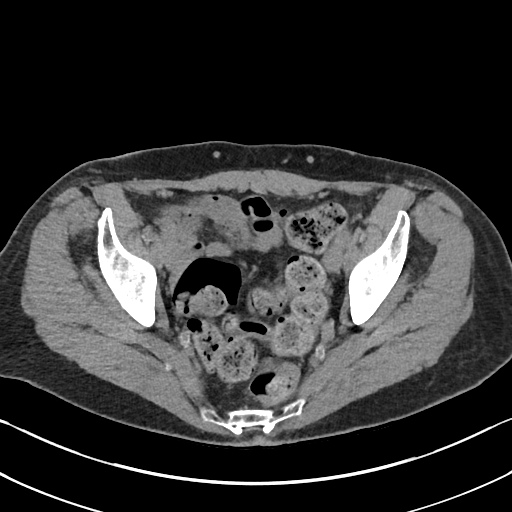
[im 38/102  soft-tissue]
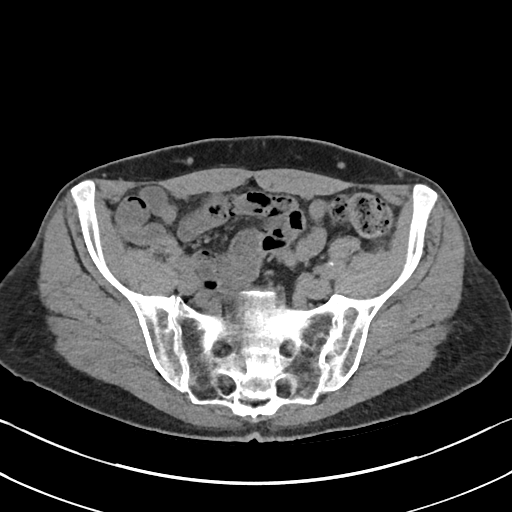
[im 47/102  soft-tissue]
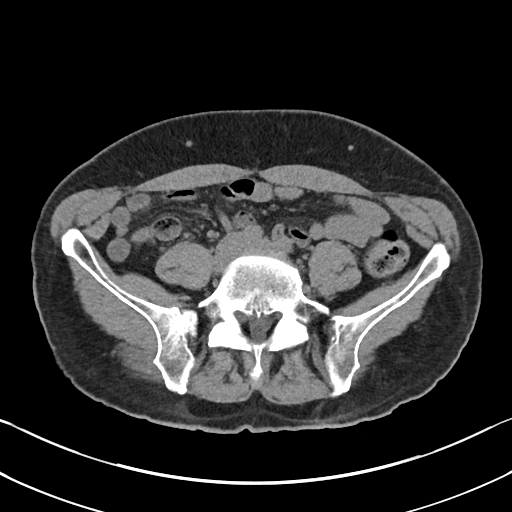
[im 55/102  soft-tissue]
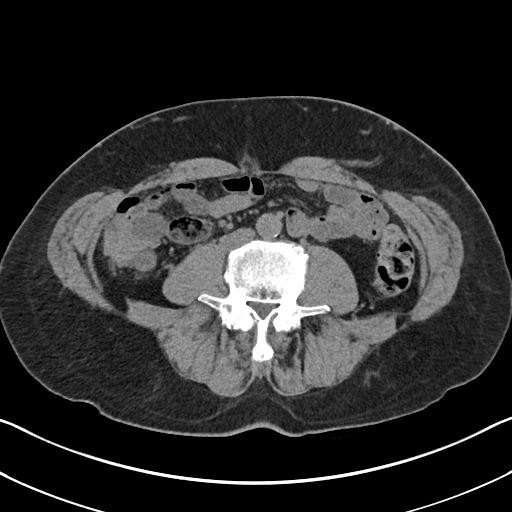
[im 64/102  soft-tissue]
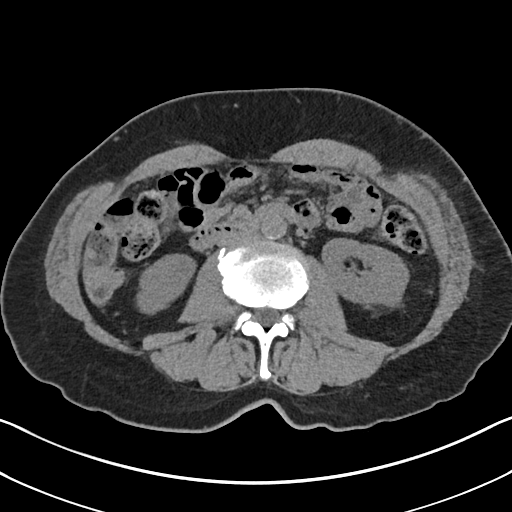
[im 72/102  soft-tissue]
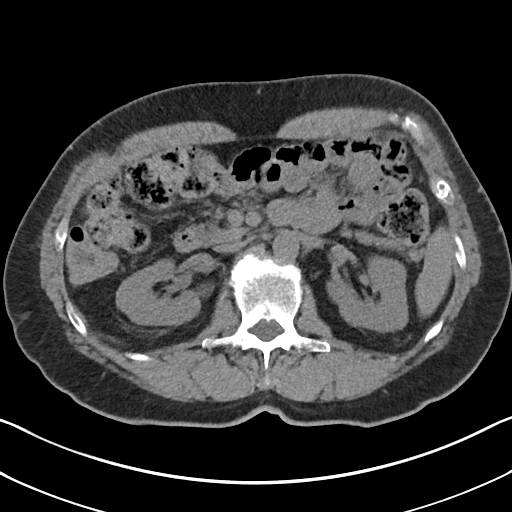
[im 72/102  bone]
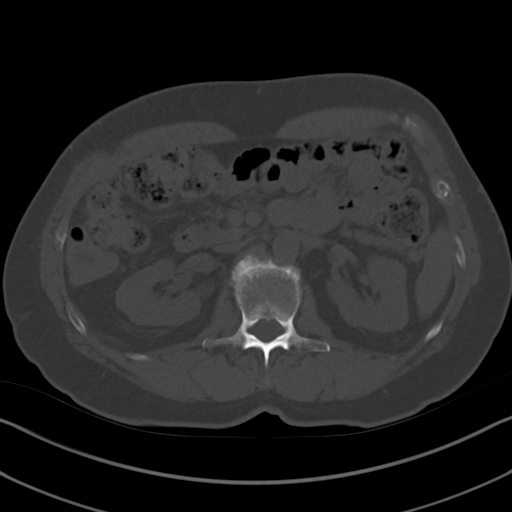
[im 80/102  soft-tissue]
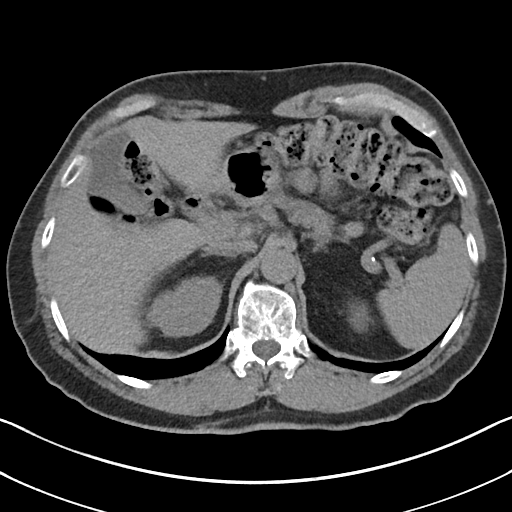
[im 89/102  soft-tissue]
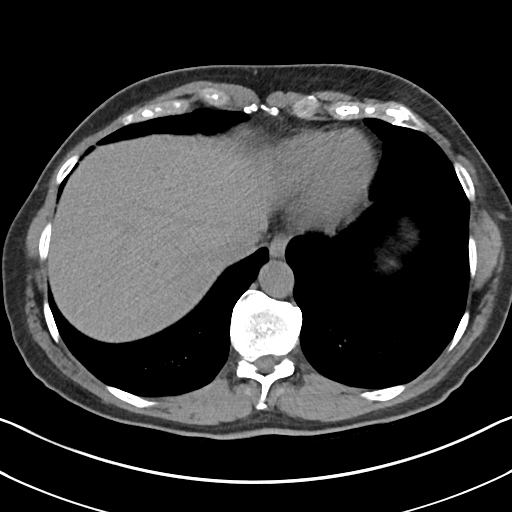
[im 97/102  soft-tissue]
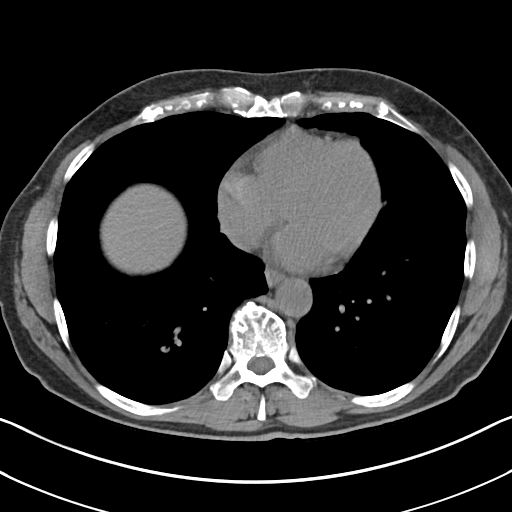

[Series 4: coronals routine abdomen pelvis without 2.00 cor · coronal · non-contrast · 0.70mm/px · 3 of 137 slices shown]
[im 46/137  soft-tissue]
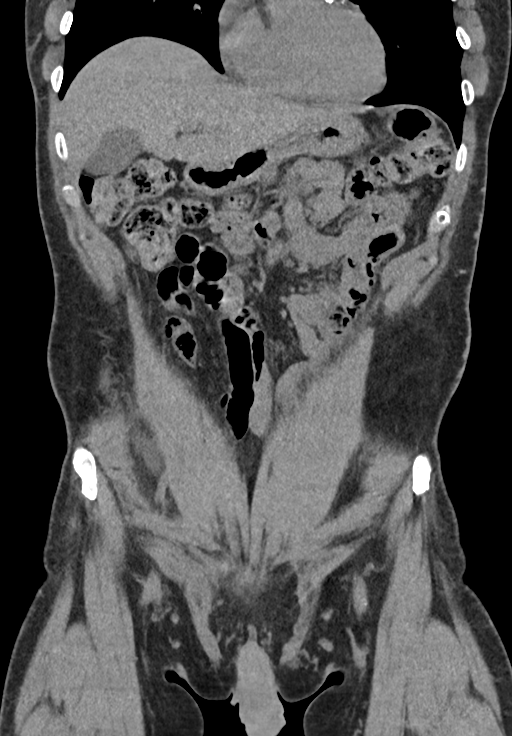
[im 61/137  soft-tissue]
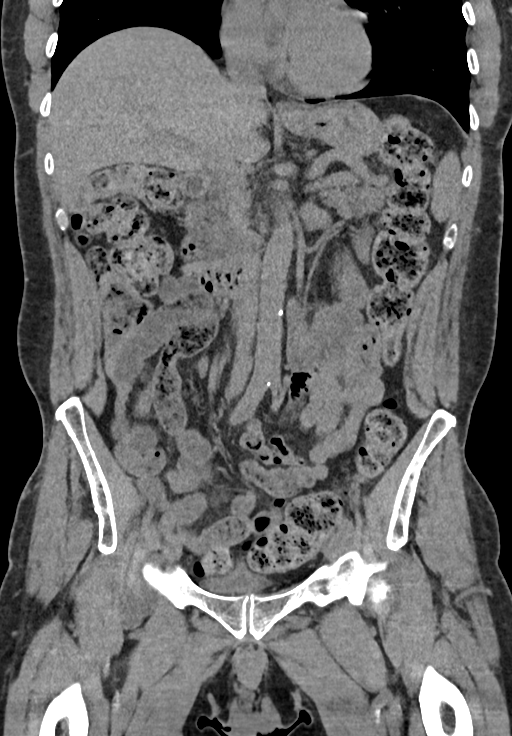
[im 76/137  soft-tissue]
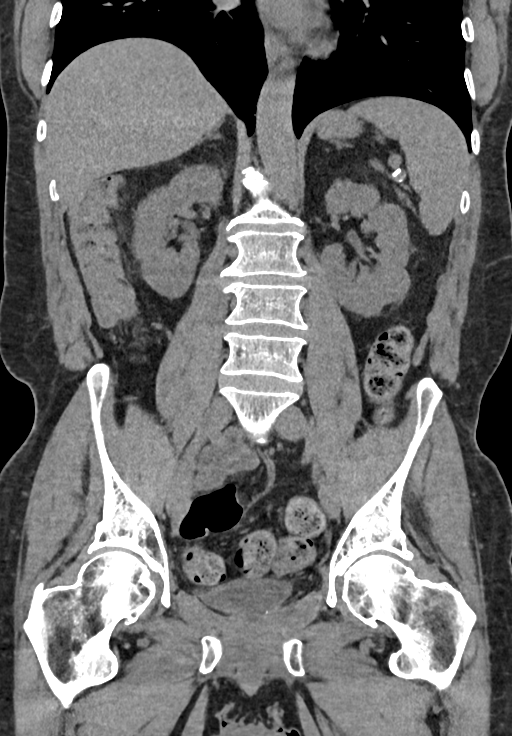

[15 of 46 positions shown; findings below may reference images not displayed]

FINDINGS: Lower chest: No acute abnormality.  Coronary artery calcification.

Hepatobiliary: No solid liver abnormality is seen. No gallstones,
gallbladder wall thickening, or biliary dilatation.

Pancreas: Unremarkable. No pancreatic ductal dilatation or
surrounding inflammatory changes.

Spleen: Normal in size without significant abnormality.

Adrenals/Urinary Tract: Adrenal glands are unremarkable. Kidneys are
normal, without renal calculi, solid lesion, or hydronephrosis.
Bladder is unremarkable.

Stomach/Bowel: Stomach is within normal limits. Appendix appears
normal. No evidence of bowel wall thickening, distention, or
inflammatory changes. Moderate burden of stool throughout the colon
and rectum.

Vascular/Lymphatic: Aortic atherosclerosis. No enlarged abdominal or
pelvic lymph nodes.

Reproductive: No mass or other significant abnormality.

Other: No abdominal wall hernia or abnormality. There is a fluid
attenuation lesion in the right groin underlying the common femoral
vessels measuring 2.5 x 2.3 cm (series 2, image 84). No
abdominopelvic ascites.

Musculoskeletal: No acute or significant osseous findings.
IMPRESSION: 1. No evidence of adrenal mass.
2. There is a fluid attenuation lesion in the right groin underlying
the common femoral vessels measuring 2.5 x 2.3 cm. This is of
uncertain nature, possibly a seroma or lymphocele following vascular
access, but almost certainly benign and incidental.
3. Coronary artery disease.

Aortic Atherosclerosis ([id number]).

## 2023-06-05 IMAGING — US US SCROTUM W/ DOPPLER COMPLETE
1 series · 13 of 25 positions shown · non-contrast
Comparison: CT of the abdomen pelvis dated 05/17/2021.

CLINICAL DATA: 70-year-old male with elevated testosterone level.

EXAM:
SCROTAL ULTRASOUND
DOPPLER ULTRASOUND OF THE TESTICLES
TECHNIQUE: Complete ultrasound examination of the testicles, epididymis, and
other scrotal structures was performed. Color and spectral Doppler
ultrasound were also utilized to evaluate blood flow to the
testicles.

[Series 1: us scrotum w/ doppler complete · 89 acquisitions, 13 frames shown]
[im 1/89]
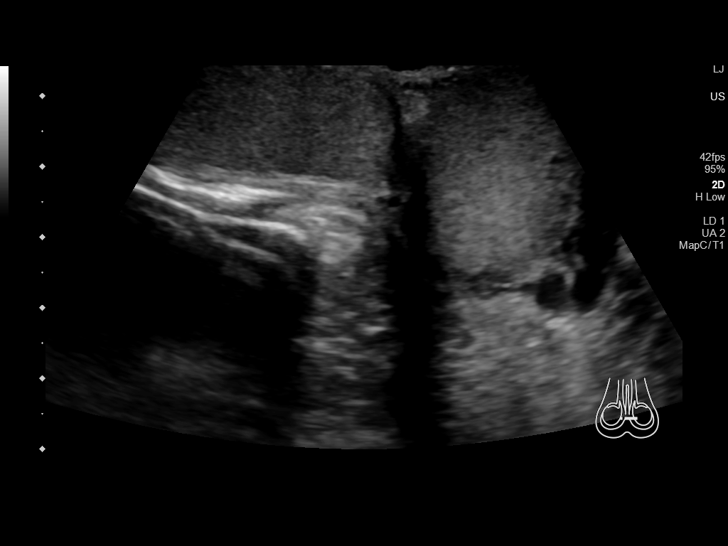
[im 8/89]
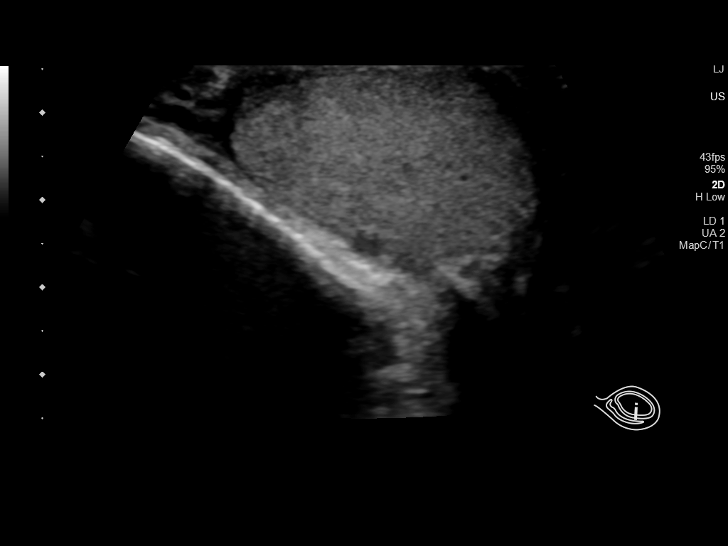
[im 15/89]
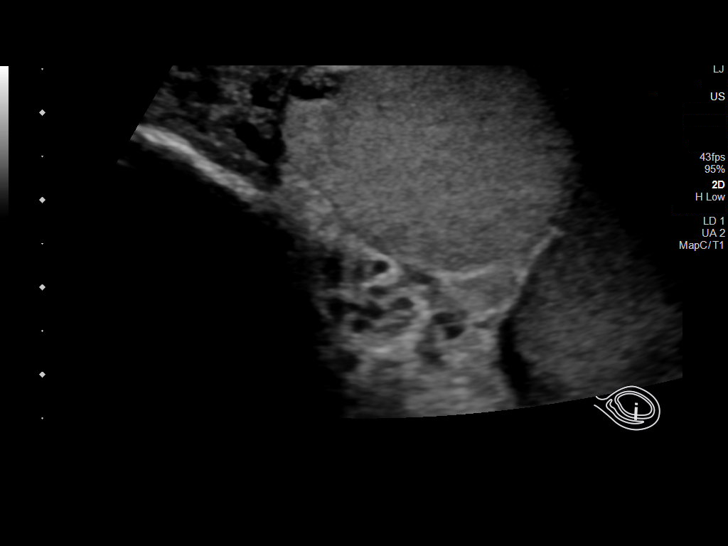
[im 23/89]
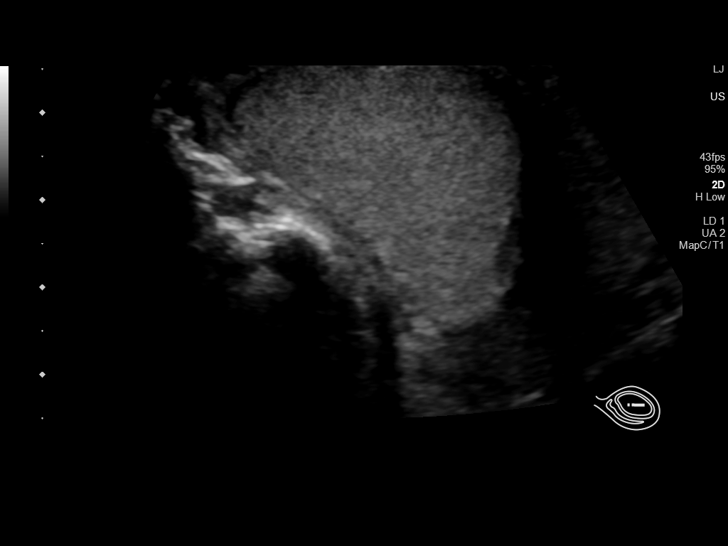
[im 30/89]
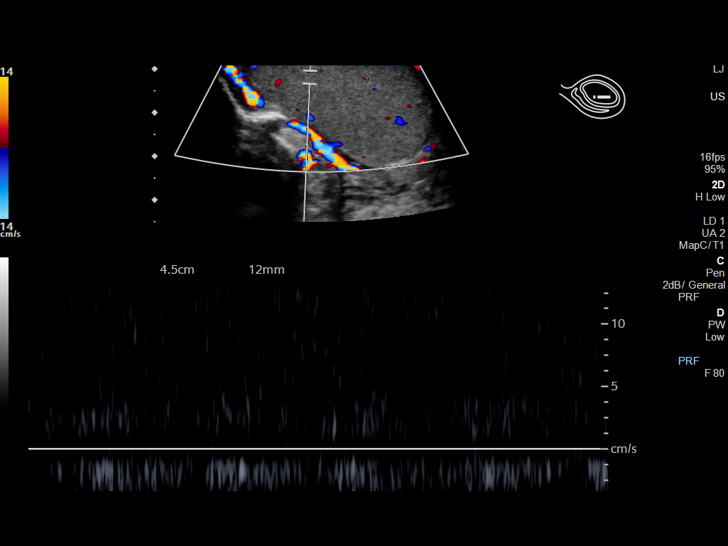
[im 37/89]
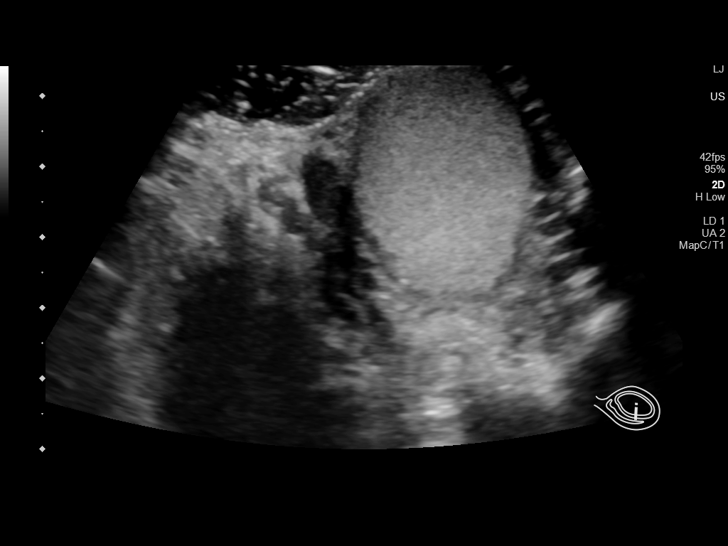
[im 45/89]
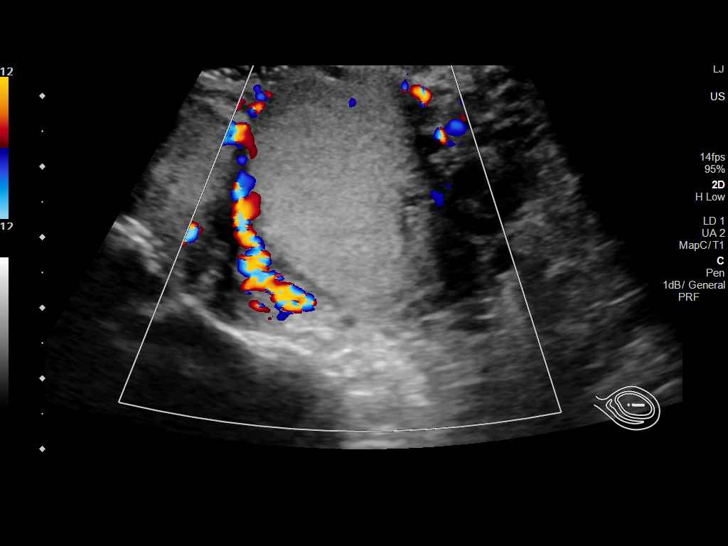
[im 52/89]
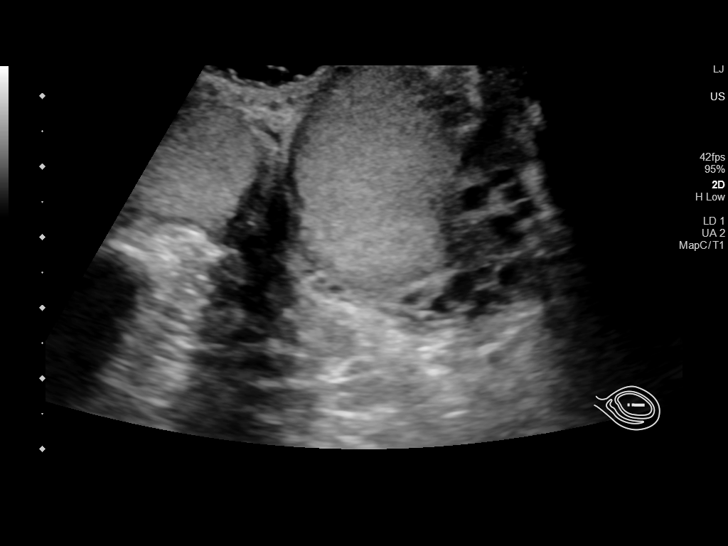
[im 59/89]
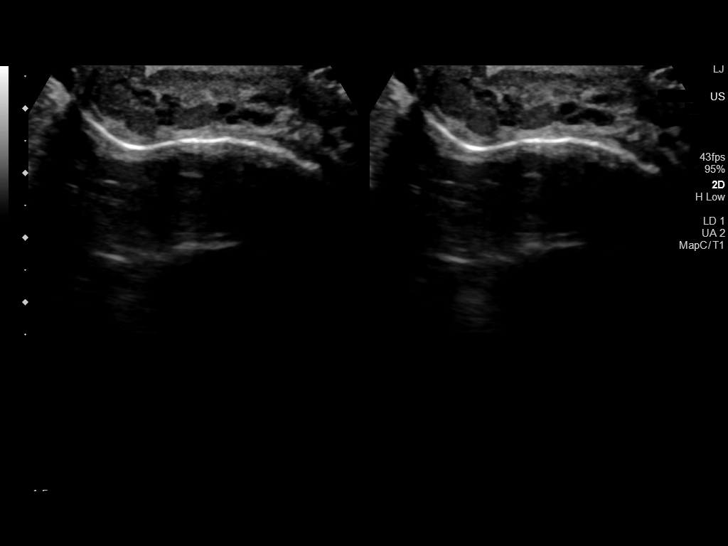
[im 67/89]
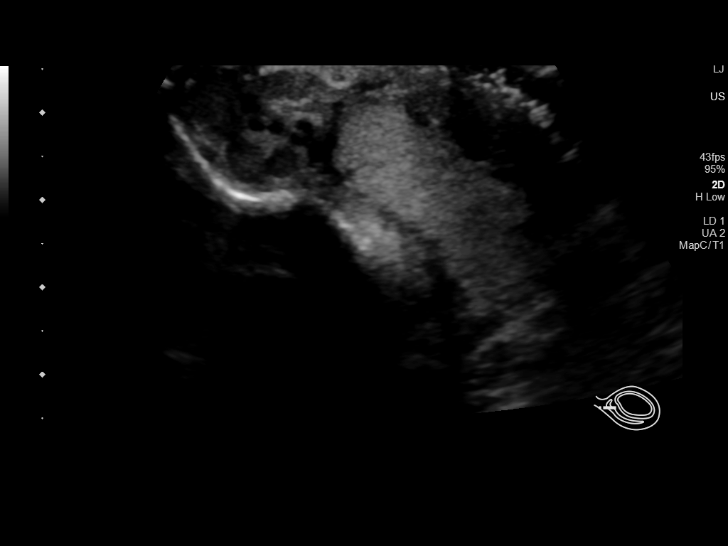
[im 74/89]
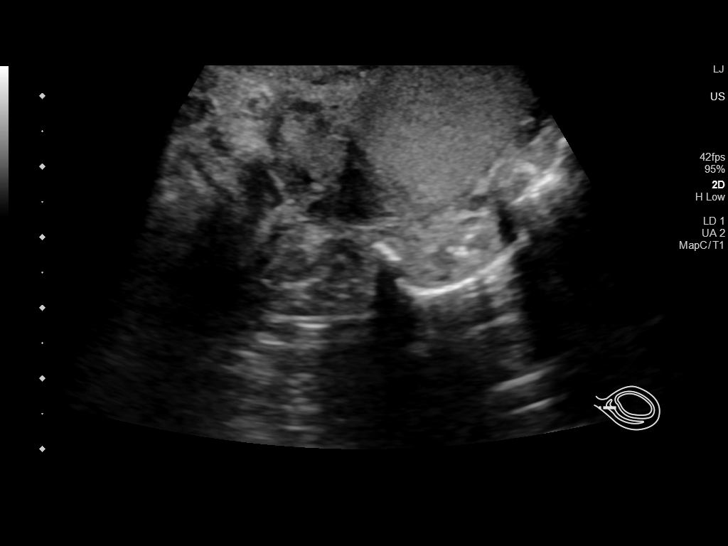
[im 81/89]
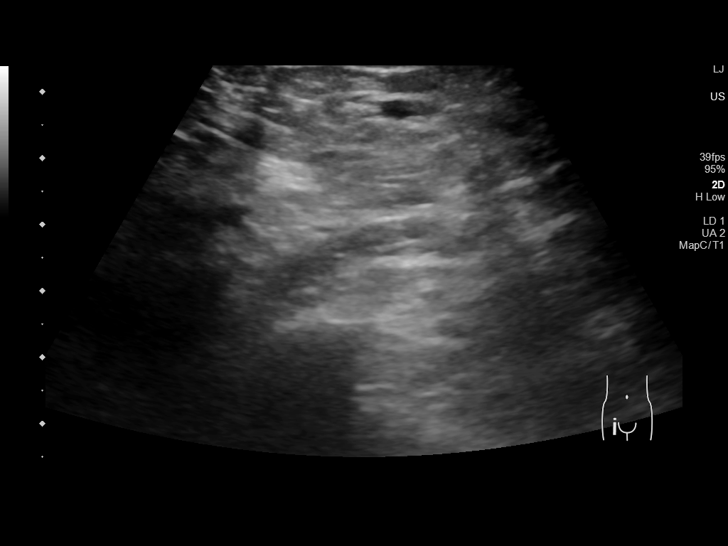
[im 89/89]
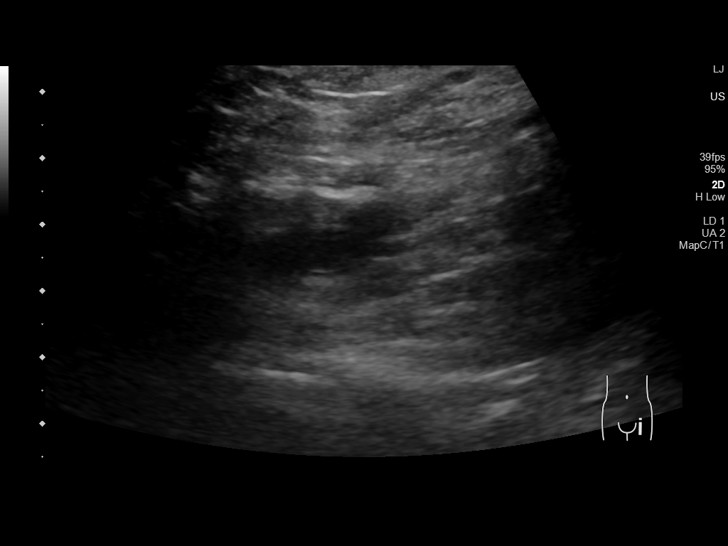

[13 of 25 positions shown; findings below may reference images not displayed]

FINDINGS: Right testicle

Measurements: 4.5 x 2.6 x 3.7 cm. The right testicle demonstrates a
normal echogenicity. There is a 7 x 3 x 6 mm cystic structure in the
periphery of the right testicle with no internal vascularity. This
is suboptimally characterized and indeterminate whether intra or
extratesticular but may represent a testicular or tunical cyst.
Clinical correlation and follow-up with ultrasound in 3 months
recommended.

Left testicle

Measurements: 3.4 x 2.9 x 2.8 cm. The left testicle demonstrates a
normal echogenicity. No focal lesion.

Right epididymis: Normal in size and appearance. A 4 mm right
epididymal cyst.

Left epididymis:  Normal in size and appearance.

Hydrocele:  None visualized.

Varicocele:  Mild left varicocele.

Pulsed Doppler interrogation of both testes demonstrates normal low
resistance arterial and venous waveforms bilaterally.
IMPRESSION: Apparent subcentimeter cystic structure along the periphery of the
right testicle may represent testicular or tunical cyst. Follow-up
with ultrasound in 3 months recommended. Otherwise unremarkable
testicular ultrasound.

## 2023-09-22 IMAGING — US US SCROTUM W/ DOPPLER COMPLETE
1 series · 14 of 25 positions shown · non-contrast
Comparison: 06/07/2021

CLINICAL DATA: Testicle cyst

EXAM:
SCROTAL ULTRASOUND
DOPPLER ULTRASOUND OF THE TESTICLES
TECHNIQUE: Complete ultrasound examination of the testicles, epididymis, and
other scrotal structures was performed. Color and spectral Doppler
ultrasound were also utilized to evaluate blood flow to the
testicles.

[Series 1: us scrotum w/ doppler complete · 0.06mm/px · 71 acquisitions, 14 frames shown]
[im 1/71]
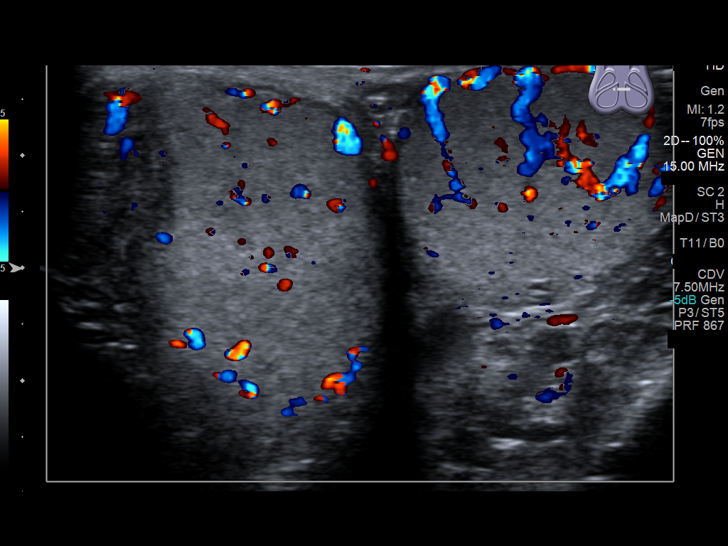
[im 6/71]
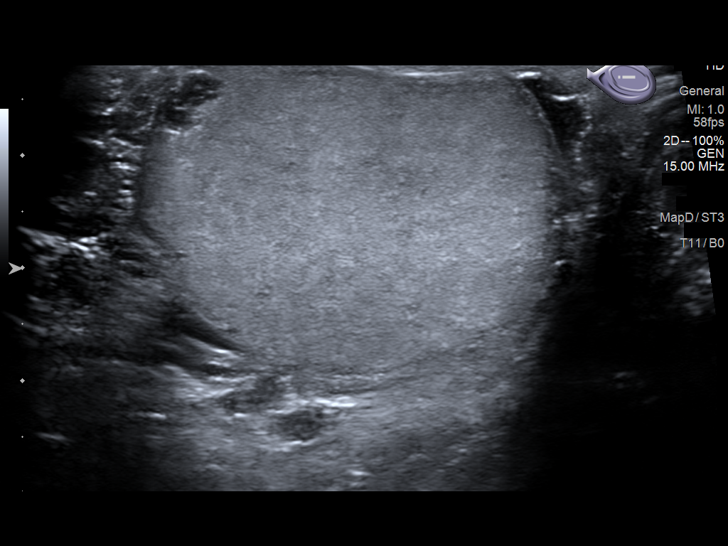
[im 12/71]
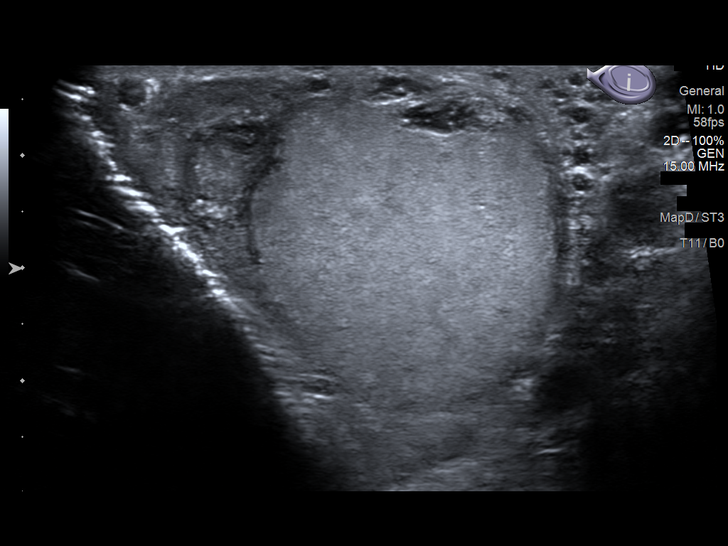
[im 18/71]
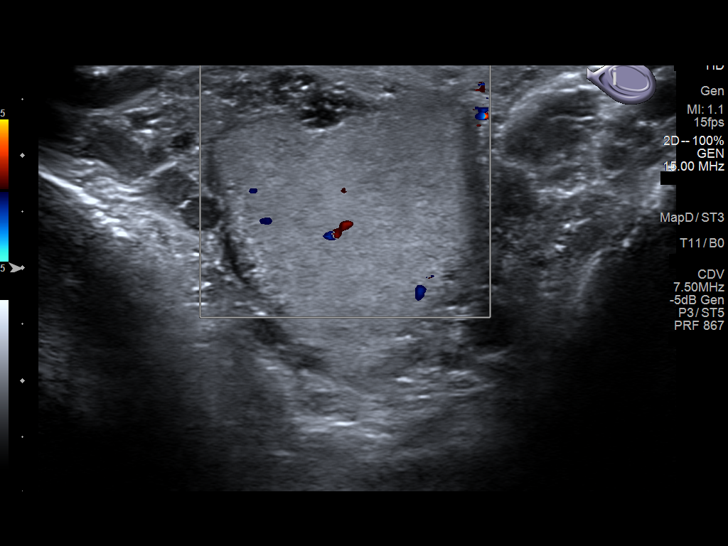
[im 24/71]
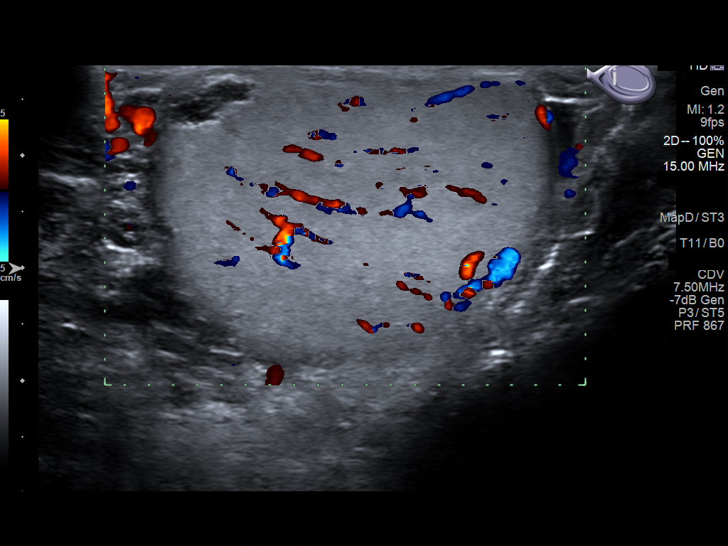
[im 27/71]
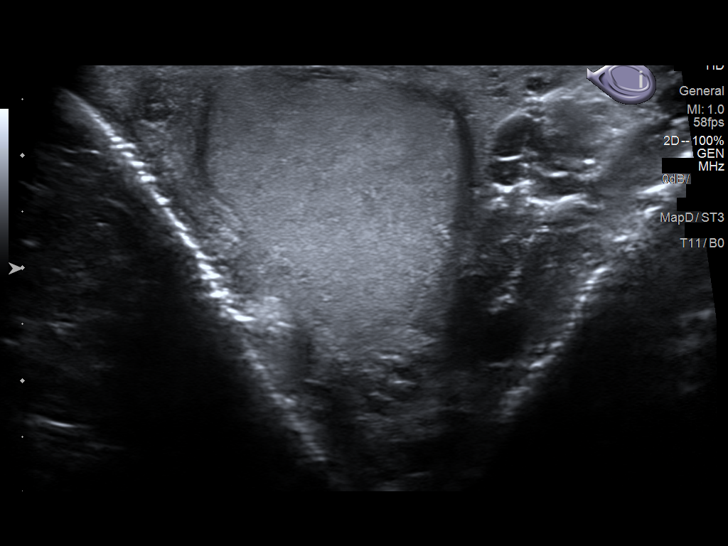
[im 33/71]
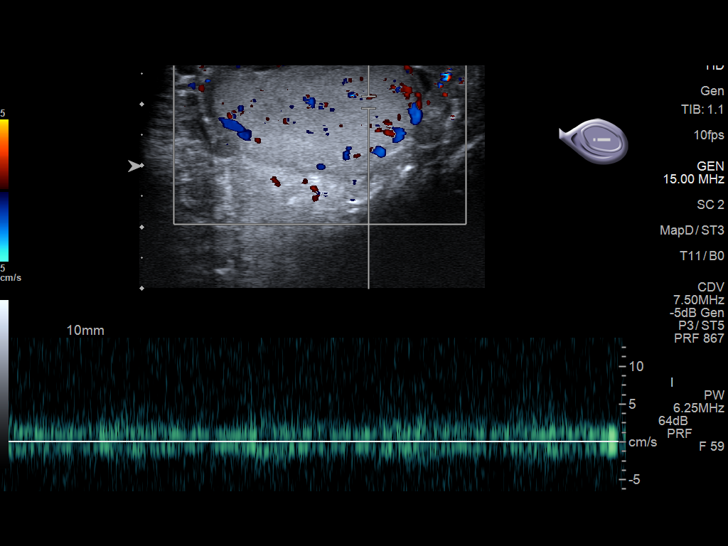
[im 38/71]
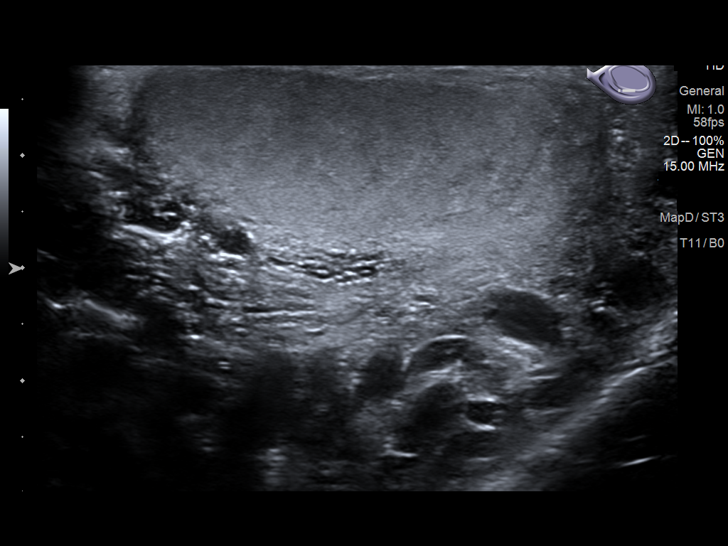
[im 44/71]
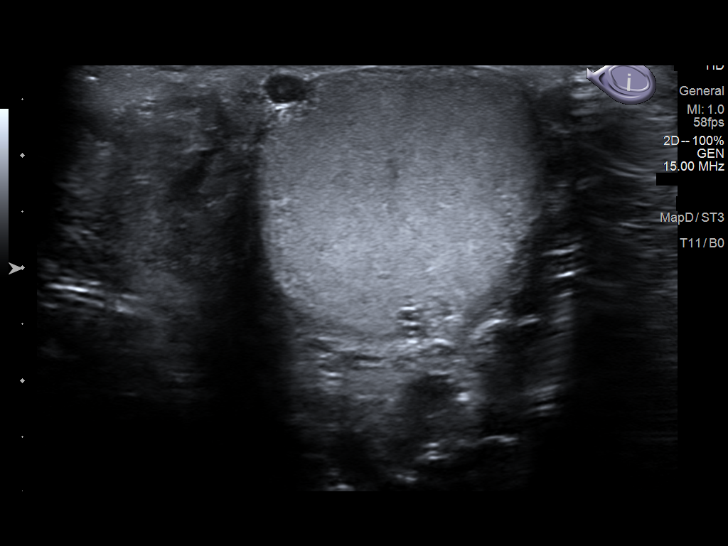
[im 47/71]
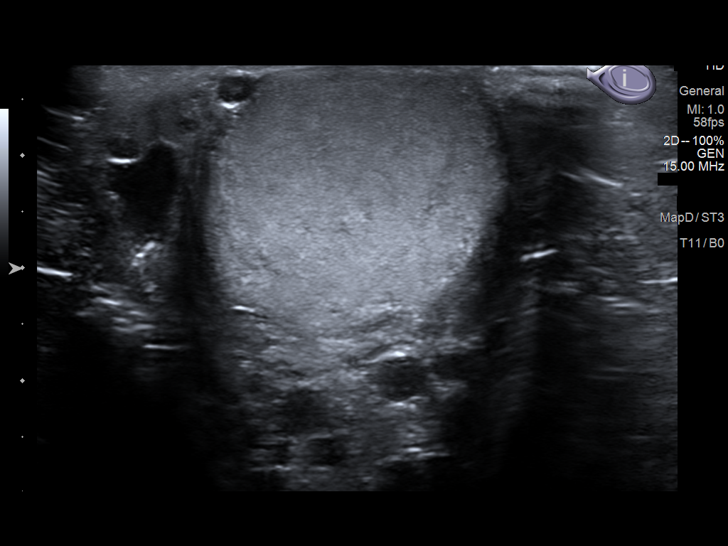
[im 53/71]
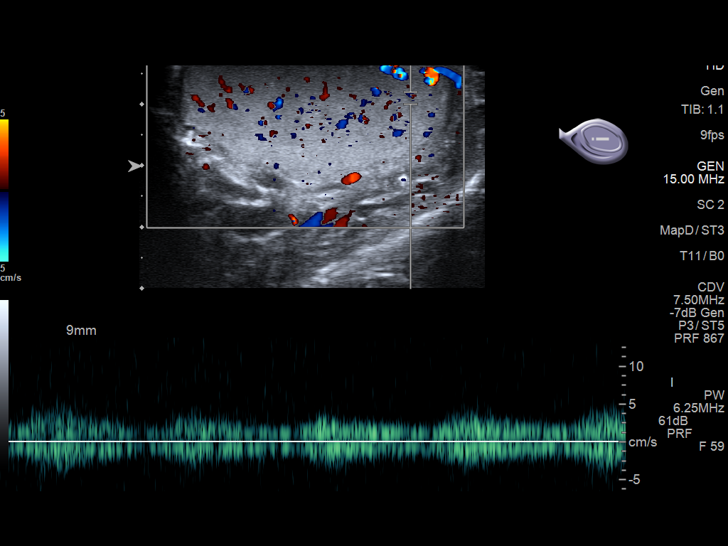
[im 59/71]
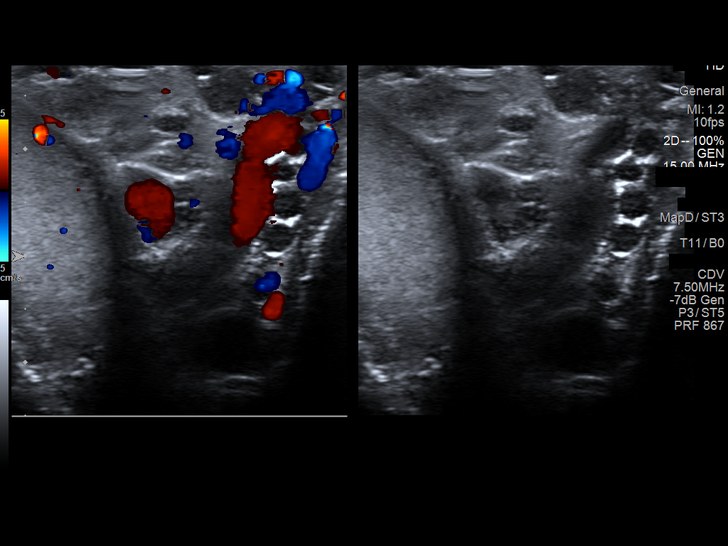
[im 65/71]
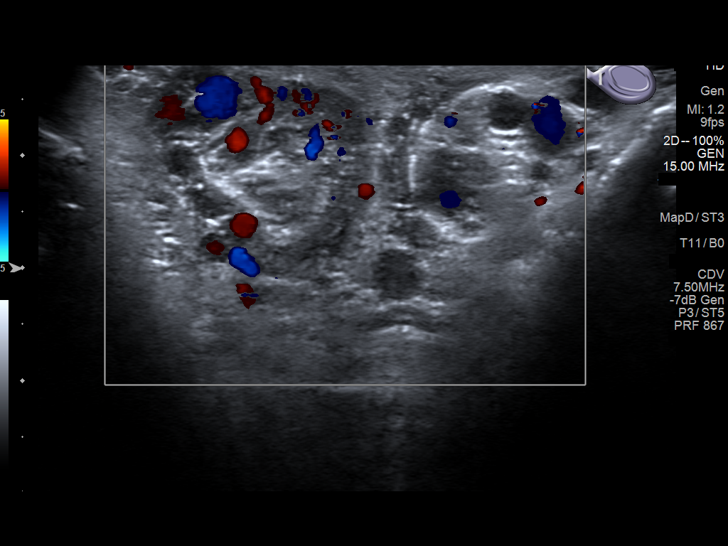
[im 71/71]
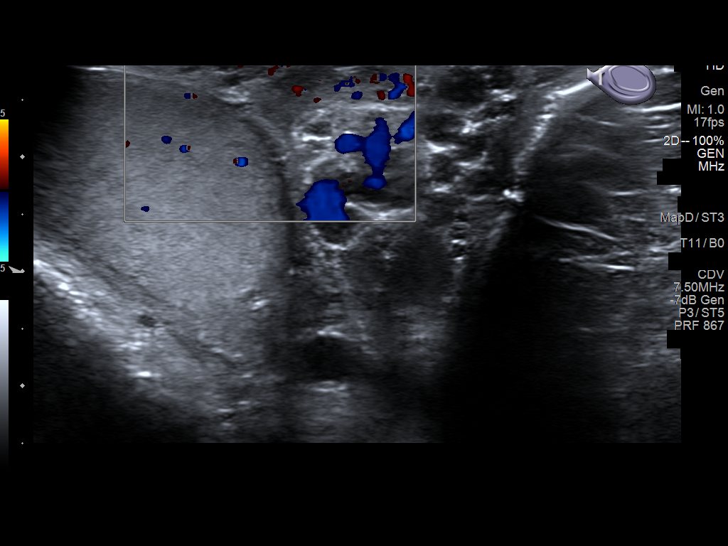

[14 of 25 positions shown; findings below may reference images not displayed]

FINDINGS: Right testicle

Measurements: 3.6 x 2.1 x 2.7 cm. Stable complex cystic lesion at
the periphery of the upper pole right testis measuring 6 x 4 x 4 mm,
previously 7 by 3 x 6 mm.

Left testicle

Measurements: 4.4 x 2.1 x 2.6 cm. No mass or microlithiasis
visualized.

Right epididymis:  Normal in size and appearance.

Left epididymis:  Normal in size and appearance.

Hydrocele:  None visualized.

Varicocele:  Small left varicocele.

Pulsed Doppler interrogation of both testes demonstrates normal low
resistance arterial and venous waveforms bilaterally.
IMPRESSION: 1. Negative for testicular torsion.
2. Essentially stable size of slightly complex cystic lesion at the
periphery of the upper pole of the right testis.
